# Patient Record
Sex: Female | Born: 1970 | Race: Black or African American | Hispanic: No | Marital: Married | Smoking: Never smoker
Health system: Southern US, Community
[De-identification: ages and names within clinical notes are randomized; demographics above are authoritative.]

## PROBLEM LIST (undated history)

## (undated) DIAGNOSIS — I10 Essential (primary) hypertension: Secondary | ICD-10-CM

## (undated) DIAGNOSIS — R87619 Unspecified abnormal cytological findings in specimens from cervix uteri: Secondary | ICD-10-CM

## (undated) DIAGNOSIS — G43909 Migraine, unspecified, not intractable, without status migrainosus: Secondary | ICD-10-CM

## (undated) HISTORY — DX: Unspecified abnormal cytological findings in specimens from cervix uteri: R87.619

---

## 2004-10-13 ENCOUNTER — Emergency Department: Payer: Self-pay | Admitting: Internal Medicine

## 2005-02-17 ENCOUNTER — Emergency Department: Payer: Self-pay | Admitting: General Practice

## 2005-02-28 ENCOUNTER — Ambulatory Visit: Payer: Self-pay | Admitting: Specialist

## 2005-05-25 ENCOUNTER — Emergency Department: Payer: Self-pay | Admitting: Emergency Medicine

## 2005-06-05 ENCOUNTER — Emergency Department: Payer: Self-pay | Admitting: Internal Medicine

## 2005-11-01 ENCOUNTER — Emergency Department: Payer: Self-pay | Admitting: Unknown Physician Specialty

## 2006-02-08 ENCOUNTER — Emergency Department: Payer: Self-pay | Admitting: Emergency Medicine

## 2006-02-10 ENCOUNTER — Ambulatory Visit: Payer: Self-pay | Admitting: Internal Medicine

## 2006-11-27 ENCOUNTER — Emergency Department: Payer: Self-pay | Admitting: Emergency Medicine

## 2006-11-28 ENCOUNTER — Other Ambulatory Visit: Payer: Self-pay

## 2009-02-13 ENCOUNTER — Emergency Department: Payer: Self-pay | Admitting: Emergency Medicine

## 2010-10-25 IMAGING — CT CT HEAD WITHOUT CONTRAST
3 of 4 series · 18 of 30 positions shown, 20 images · non-contrast
Comparison: none

REASON FOR EXAM: severe headache
COMMENTS:

[Series 2: without · axial · non-contrast · 0.41mm/px · z∈[-155,-35]mm · 8 of 32 slices shown, 10 images]
[im 4/32  brain]
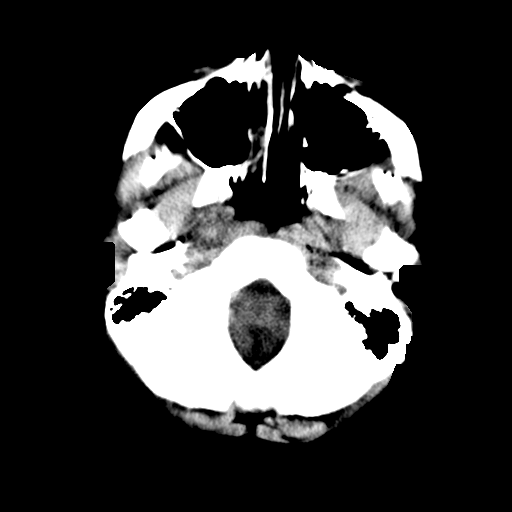
[im 4/32  bone]
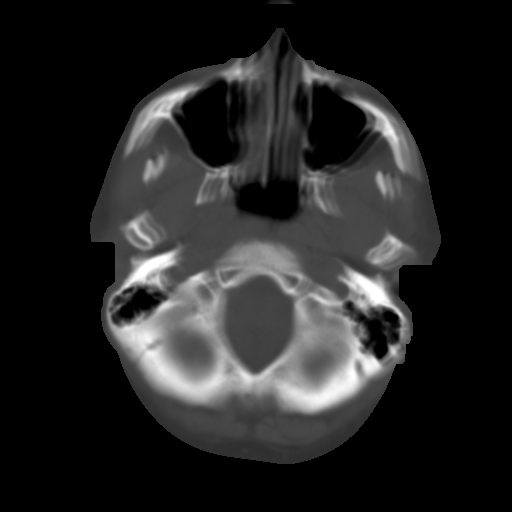
[im 7/32  brain]
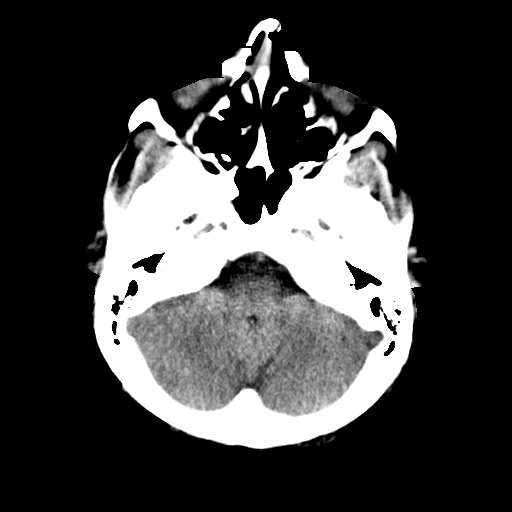
[im 11/32  brain]
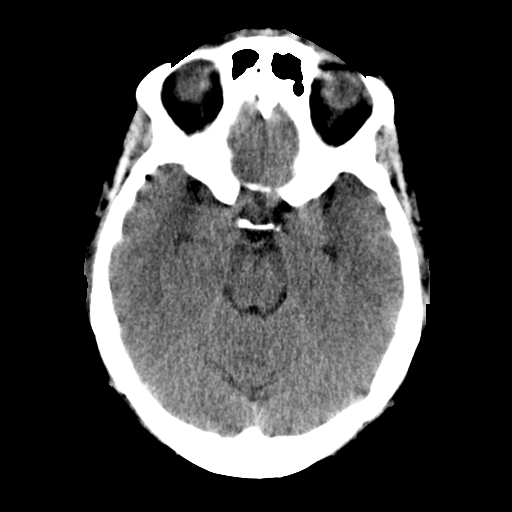
[im 14/32  brain]
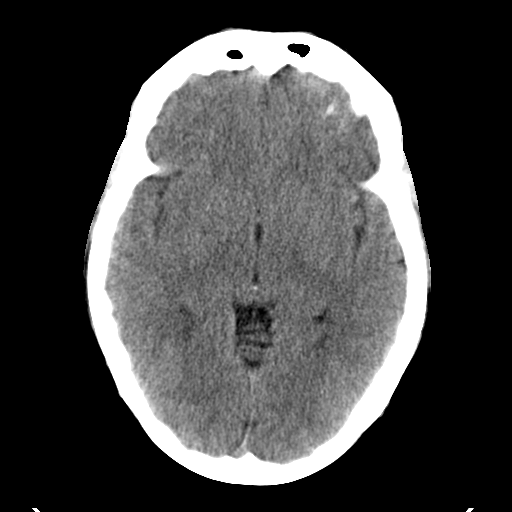
[im 18/32  brain]
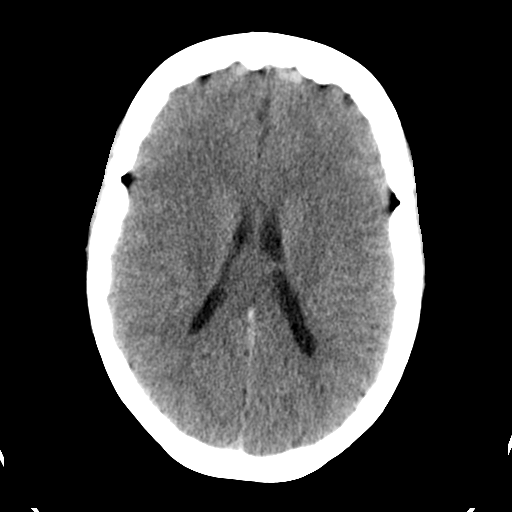
[im 18/32  bone]
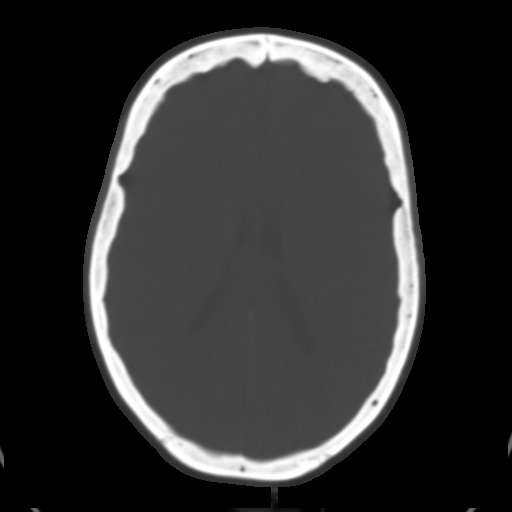
[im 21/32  brain]
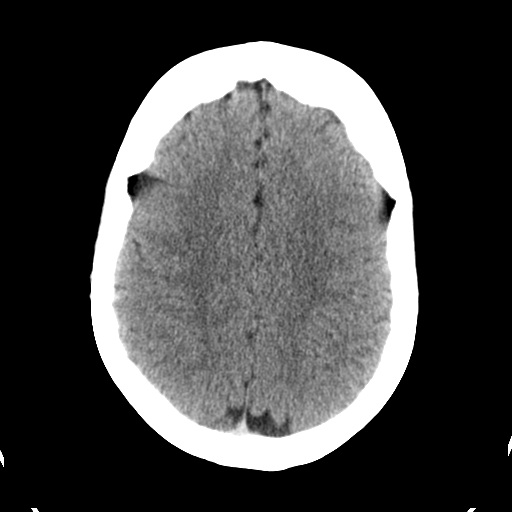
[im 25/32  brain]
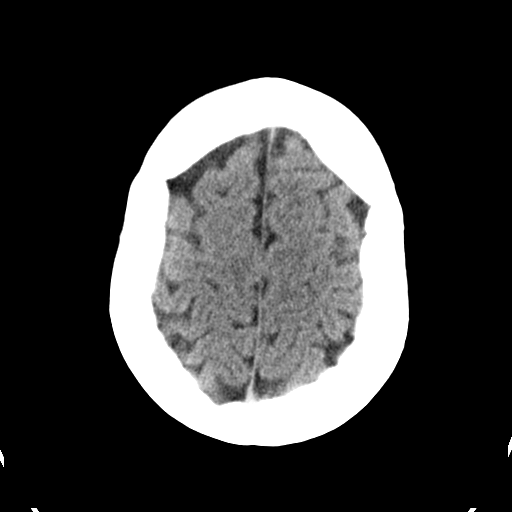
[im 28/32  brain]
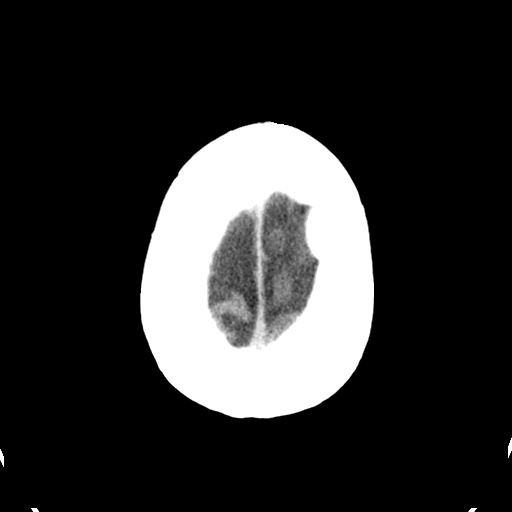

[Series 3: bone · axial · 0.41mm/px · z∈[-155,-50]mm · 7 of 32 slices shown]
[im 4/32  bone]
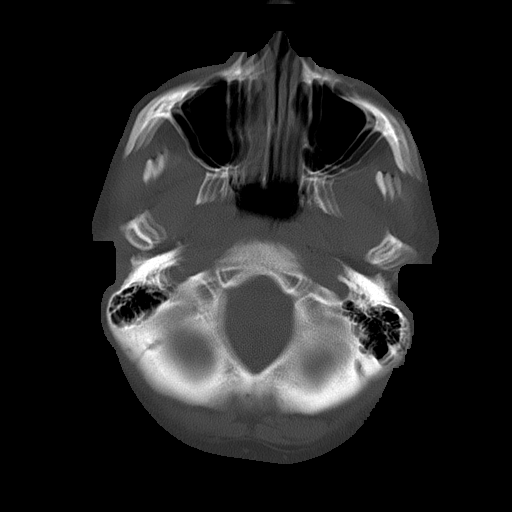
[im 7/32  bone]
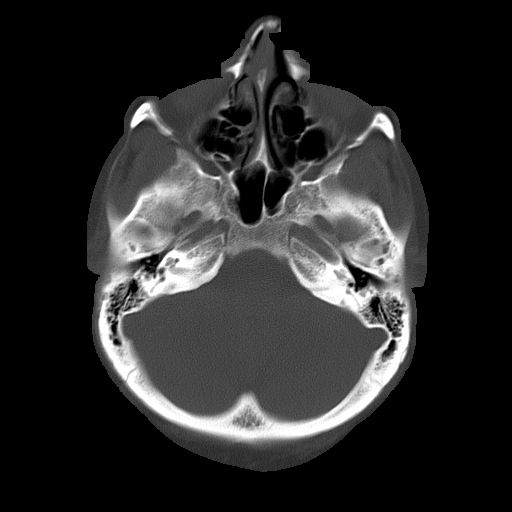
[im 11/32  bone]
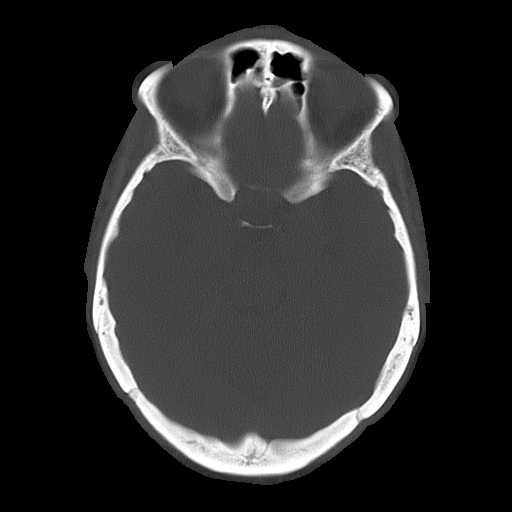
[im 14/32  bone]
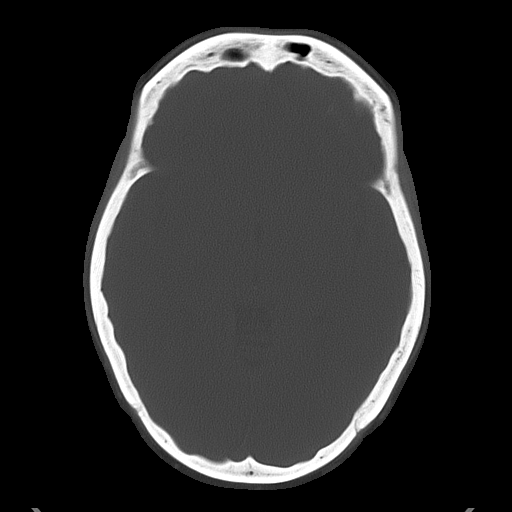
[im 18/32  bone]
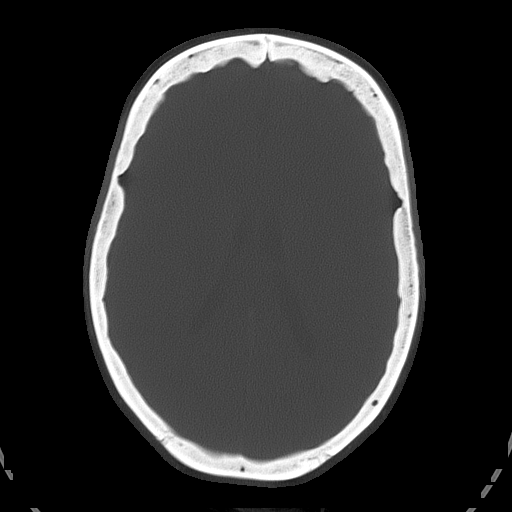
[im 21/32  bone]
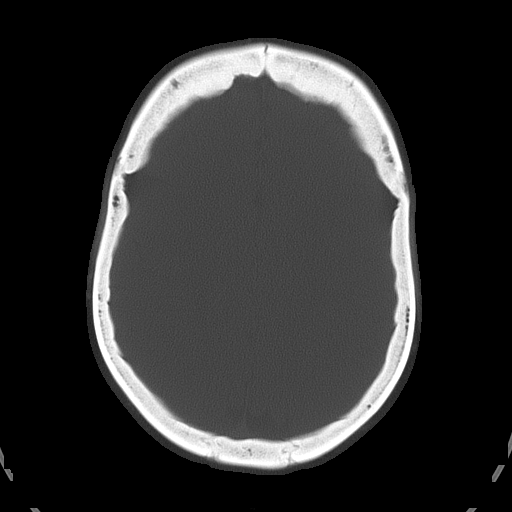
[im 25/32  bone]
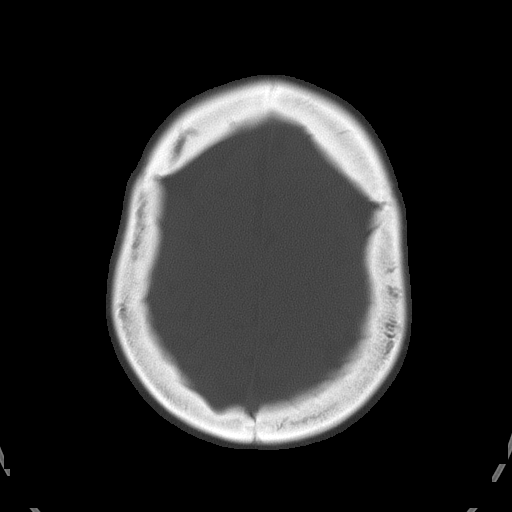

[Series 4: without repeat · axial · non-contrast · 0.41mm/px · z∈[-155,-125]mm · 3 of 14 slices shown]
[im 4/14  brain]
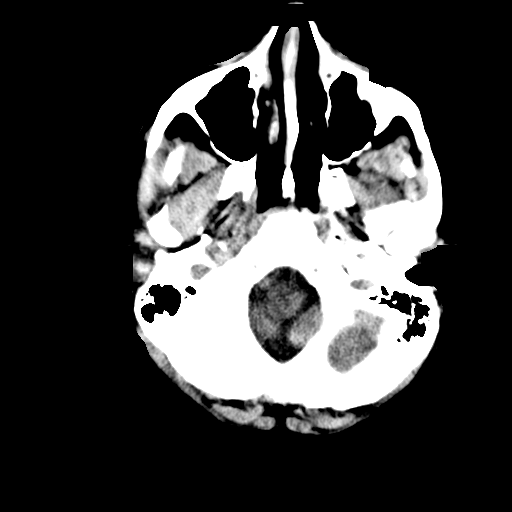
[im 7/14  brain]
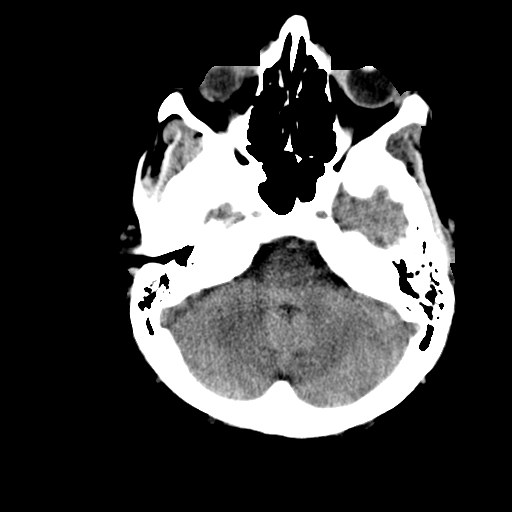
[im 10/14  brain]
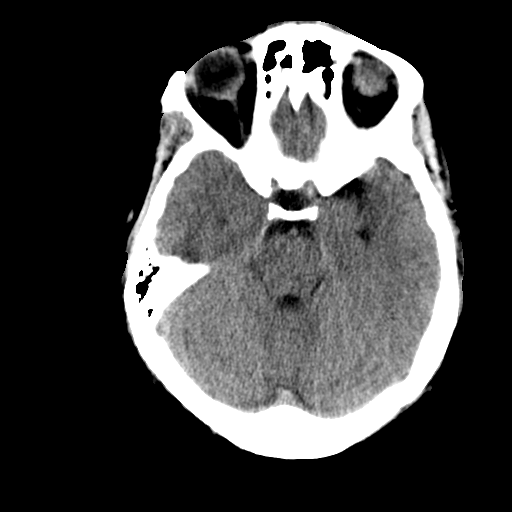

[18 of 30 positions shown; findings below may reference images not displayed]

PROCEDURE:     CT  - CT HEAD WITHOUT CONTRAST  - February 13, 2009  [DATE]

RESULT:     History: Migraines. Comparison study: No prior

Procedure and findings: Standard nonenhanced head CT obtained. No mass
lesion noted. No hydrocephalus noted. No acute abnormalities identified. No
acute bony undermines identified.
IMPRESSION: No acute abnormality.

## 2012-05-23 ENCOUNTER — Emergency Department: Payer: Self-pay | Admitting: *Deleted

## 2012-05-23 LAB — BASIC METABOLIC PANEL
Anion Gap: 8 (ref 7–16)
BUN: 12 mg/dL (ref 7–18)
Creatinine: 0.7 mg/dL (ref 0.60–1.30)
EGFR (African American): >60
Glucose: 98 mg/dL (ref 65–99)
Osmolality: 281 (ref 275–301)

## 2013-06-02 ENCOUNTER — Emergency Department: Payer: Self-pay | Admitting: Internal Medicine

## 2014-08-14 ENCOUNTER — Ambulatory Visit: Payer: Self-pay | Admitting: Podiatry

## 2014-08-19 DIAGNOSIS — I10 Essential (primary) hypertension: Secondary | ICD-10-CM | POA: Insufficient documentation

## 2014-09-09 ENCOUNTER — Ambulatory Visit: Payer: Self-pay | Admitting: Podiatry

## 2015-12-28 ENCOUNTER — Ambulatory Visit
Admission: RE | Admit: 2015-12-28 | Discharge: 2015-12-28 | Disposition: A | Discharge status: Home / Self Care | Payer: Commercial Managed Care - PPO | Source: Ambulatory Visit | Attending: Nurse Practitioner | Admitting: Nurse Practitioner

## 2015-12-28 ENCOUNTER — Other Ambulatory Visit: Payer: Self-pay | Admitting: Nurse Practitioner

## 2015-12-28 DIAGNOSIS — M79605 Pain in left leg: Secondary | ICD-10-CM | POA: Diagnosis present

## 2016-01-24 ENCOUNTER — Emergency Department
Admission: EM | Admit: 2016-01-24 | Discharge: 2016-01-24 | Disposition: A | Discharge status: Home / Self Care | Payer: Commercial Managed Care - PPO | Attending: Emergency Medicine | Admitting: Emergency Medicine

## 2016-01-24 DIAGNOSIS — M5432 Sciatica, left side: Secondary | ICD-10-CM

## 2016-01-24 DIAGNOSIS — M5442 Lumbago with sciatica, left side: Secondary | ICD-10-CM | POA: Insufficient documentation

## 2016-01-24 DIAGNOSIS — M545 Low back pain: Secondary | ICD-10-CM | POA: Diagnosis present

## 2016-01-24 MED ORDER — IBUPROFEN 800 MG PO TABS: 800.0000 mg | ORAL_TABLET | Freq: Three times a day (TID) | ORAL | Status: DC | PRN

## 2016-01-24 MED ORDER — OXYCODONE-ACETAMINOPHEN 5-325 MG PO TABS: 1.0000 | ORAL_TABLET | ORAL | Status: DC | PRN

## 2016-01-24 MED ORDER — HYDROMORPHONE HCL 1 MG/ML IJ SOLN
1.0000 mg | Freq: Once | INTRAMUSCULAR | Status: AC
  Administered 2016-01-24: 1 mg via INTRAMUSCULAR
  Filled 2016-01-24: qty 1

## 2016-01-24 MED ORDER — BACLOFEN 10 MG PO TABS: 10.0000 mg | ORAL_TABLET | Freq: Three times a day (TID) | ORAL | Status: DC

## 2016-01-24 MED ORDER — DIAZEPAM 5 MG/ML IJ SOLN
5.0000 mg | Freq: Once | INTRAMUSCULAR | Status: AC
  Administered 2016-01-24: 5 mg via INTRAMUSCULAR
  Filled 2016-01-24: qty 2

## 2016-01-24 NOTE — ED Provider Notes (Signed)
[facility] Emergency Department Provider Note  ____________________________________________  Time seen: Approximately 12:06 PM  I have reviewed the triage vital signs and the nursing notes.   HISTORY  Chief Complaint Back Pain and Leg Pain    HPI Sheila Banks is a 45 y.o. female presents for evaluation of acute exacerbation of sciatica down her left leg. Patient states that she has a orthopedic clinic that she goes to on a regular basis and schedule for an MRI later this week. States an acute exacerbation last couple days. Denies any recent trauma describes her pain as 10 over 10. Patient reports that she's had multiple x-rays and ultrasounds of her leg and is just waiting for the MRI test.   No past medical history on file.  There are no active problems to display for this patient.   No past surgical history on file.  Current Outpatient Rx  Name  Route  Sig  Dispense  Refill  . baclofen (LIORESAL) 10 MG tablet   Oral   Take 1 tablet (10 mg total) by mouth 3 (three) times daily.   30 tablet   0   . ibuprofen (ADVIL,MOTRIN) 800 MG tablet   Oral   Take 1 tablet (800 mg total) by mouth every 8 (eight) hours as needed.   30 tablet   0   . oxyCODONE-acetaminophen (ROXICET) 5-325 MG tablet   Oral   Take 1-2 tablets by mouth every 4 (four) hours as needed for severe pain.   15 tablet   0     Allergies Penicillins  No family history on file.  Social History Social History  Substance Use Topics  . Smoking status: Not on file  . Smokeless tobacco: Not on file  . Alcohol Use: Not on file    Review of Systems Constitutional: No fever/chills Cardiovascular: Denies chest pain. Respiratory: Denies shortness of breath. Musculoskeletal: As if her lower back pain and left side radiating down the left leg. Skin: Negative for rash. Neurological: Negative for headaches, focal weakness or numbness.  10-point ROS otherwise  negative.  ____________________________________________   PHYSICAL EXAM:  VITAL SIGNS: ED Triage Vitals  Enc Vitals Group     BP 01/24/16 1203 126/82 mmHg     Pulse Rate 01/24/16 1203 97     Resp 01/24/16 1203 18     Temp 01/24/16 1203 98.3 F (36.8 C)     Temp Source 01/24/16 1203 Oral     SpO2 01/24/16 1203 96 %     Weight 01/24/16 1204 176 lb (79.833 kg)     Height 01/24/16 1204 5' 2.5" (1.588 m)     Head Cir --      Peak Flow --      Pain Score 01/24/16 1203 10     Pain Loc --      Pain Edu? --      Excl. in GC? --     Constitutional: Alert and oriented. Well appearing and in no acute distress.  Cardiovascular: Normal rate, regular rhythm. Grossly normal heart sounds.  Good peripheral circulation. Respiratory: Normal respiratory effort.  No retractions. Lungs CTAB. Musculoskeletal: Left leg distally neurovascularly intact. Pedal pulses. Straight leg raise positive on the left at about 10-15. Neurologic:  Normal speech and language. No gross focal neurologic deficits are appreciated. No gait instability. Skin:  Skin is warm, dry and intact. No rash noted. Psychiatric: Mood and affect are normal. Speech and behavior are normal.  ____________________________________________   LABS (all  labs ordered are listed, but only abnormal results are displayed)  Labs Reviewed - No data to display ____________________________________________  EKG   ____________________________________________  RADIOLOGY  Deferred at this time ____________________________________________   PROCEDURES  Procedure(s) performed: None  Critical Care performed: No  ____________________________________________   INITIAL IMPRESSION / ASSESSMENT AND PLAN / ED COURSE  Pertinent labs & imaging results that were available during my care of the patient were reviewed by me and considered in my medical decision making (see chart for details).  Acute exacerbation of sciatica. Encourage  patient to increase her gabapentin 1 tablet every 3 days to a maximum of 2700 mg daily or 3 tablets 3 times daily. Rx given for baclofen 10 mg 3 times a day ibuprofen 800 mg 3 times a day. We'll give her a three-day course of Percocet so she can follow up with her PCP. ____________________________________________   FINAL CLINICAL IMPRESSION(S) / ED DIAGNOSES  Final diagnoses:  Sciatica of left side     This chart was dictated using voice recognition software/Dragon. Despite best efforts to proofread, errors can occur which can change the meaning. Any change was purely unintentional.   Evangeline Dakin, PA-C 01/24/16 1212  Minna Antis, MD 01/24/16 [phone]

## 2016-01-24 NOTE — Discharge Instructions (Signed)
Radicular Pain °Radicular pain in either the arm or leg is usually from a bulging or herniated disk in the spine. A piece of the herniated disk may press against the nerves as the nerves exit the spine. This causes pain which is felt at the tips of the nerves down the arm or leg. Other causes of radicular pain may include: °· Fractures. °· Heart disease. °· Cancer. °· An abnormal and usually degenerative state of the nervous system or nerves (neuropathy). °Diagnosis may require CT or MRI scanning to determine the primary cause.  °Nerves that start at the neck (nerve roots) may cause radicular pain in the outer shoulder and arm. It can spread down to the thumb and fingers. The symptoms vary depending on which nerve root has been affected. In most cases radicular pain improves with conservative treatment. Neck problems may require physical therapy, a neck collar, or cervical traction. Treatment may take many weeks, and surgery may be considered if the symptoms do not improve.  °Conservative treatment is also recommended for sciatica. Sciatica causes pain to radiate from the lower back or buttock area down the leg into the foot. Often there is a history of back problems. Most patients with sciatica are better after 2 to 4 weeks of rest and other supportive care. Short term bed rest can reduce the disk pressure considerably. Sitting, however, is not a good position since this increases the pressure on the disk. You should avoid bending, lifting, and all other activities which make the problem worse. Traction can be used in severe cases. Surgery is usually reserved for patients who do not improve within the first months of treatment. °Only take over-the-counter or prescription medicines for pain, discomfort, or fever as directed by your caregiver. Narcotics and muscle relaxants may help by relieving more severe pain and spasm and by providing mild sedation. Cold or massage can give significant relief. Spinal manipulation  is not recommended. It can increase the degree of disc protrusion. Epidural steroid injections are often effective treatment for radicular pain. These injections deliver medicine to the spinal nerve in the space between the protective covering of the spinal cord and back bones (vertebrae). Your caregiver can give you more information about steroid injections. These injections are most effective when given within two weeks of the onset of pain.  °You should see your caregiver for follow up care as recommended. A program for neck and back injury rehabilitation with stretching and strengthening exercises is an important part of management.  °SEEK IMMEDIATE MEDICAL CARE IF: °· You develop increased pain, weakness, or numbness in your arm or leg. °· You develop difficulty with bladder or bowel control. °· You develop abdominal pain. °  °This information is not intended to replace advice given to you by your health care provider. Make sure you discuss any questions you have with your health care provider. °  °Document Released: 09/22/2004 Document Revised: 09/05/2014 Document Reviewed: 03/11/2015 °Elsevier Interactive Patient Education ©2016 [employer] ° °Sciatica °Sciatica is pain, weakness, numbness, or tingling along the path of the sciatic nerve. The nerve starts in the lower back and runs down the back of each leg. The nerve controls the muscles in the lower leg and in the back of the knee, while also providing sensation to the back of the thigh, lower leg, and the sole of your foot. Sciatica is a symptom of another medical condition. For instance, nerve damage or certain conditions, such as a herniated disk or bone spur on the   spine, pinch or put pressure on the sciatic nerve. This causes the pain, weakness, or other sensations normally associated with sciatica. Generally, sciatica only affects one side of the body. °CAUSES  °· Herniated or slipped disc. °· Degenerative disk disease. °· A pain disorder involving  the narrow muscle in the buttocks (piriformis syndrome). °· Pelvic injury or fracture. °· Pregnancy. °· Tumor (rare). °SYMPTOMS  °Symptoms can vary from mild to very severe. The symptoms usually travel from the low back to the buttocks and down the back of the leg. Symptoms can include: °· Mild tingling or dull aches in the lower back, leg, or hip. °· Numbness in the back of the calf or sole of the foot. °· Burning sensations in the lower back, leg, or hip. °· Sharp pains in the lower back, leg, or hip. °· Leg weakness. °· Severe back pain inhibiting movement. °These symptoms may get worse with coughing, sneezing, laughing, or prolonged sitting or standing. Also, being overweight may worsen symptoms. °DIAGNOSIS  °Your caregiver will perform a physical exam to look for common symptoms of sciatica. He or she may ask you to do certain movements or activities that would trigger sciatic nerve pain. Other tests may be performed to find the cause of the sciatica. These may include: °· Blood tests. °· X-rays. °· Imaging tests, such as an MRI or CT scan. °TREATMENT  °Treatment is directed at the cause of the sciatic pain. Sometimes, treatment is not necessary and the pain and discomfort goes away on its own. If treatment is needed, your caregiver may suggest: °· Over-the-counter medicines to relieve pain. °· Prescription medicines, such as anti-inflammatory medicine, muscle relaxants, or narcotics. °· Applying heat or ice to the painful area. °· Steroid injections to lessen pain, irritation, and inflammation around the nerve. °· Reducing activity during periods of pain. °· Exercising and stretching to strengthen your abdomen and improve flexibility of your spine. Your caregiver may suggest losing weight if the extra weight makes the back pain worse. °· Physical therapy. °· Surgery to eliminate what is pressing or pinching the nerve, such as a bone spur or part of a herniated disk. °HOME CARE INSTRUCTIONS  °· Only take  over-the-counter or prescription medicines for pain or discomfort as directed by your caregiver. °· Apply ice to the affected area for 20 minutes, 3-4 times a day for the first 48-72 hours. Then try heat in the same way. °· Exercise, stretch, or perform your usual activities if these do not aggravate your pain. °· Attend physical therapy sessions as directed by your caregiver. °· Keep all follow-up appointments as directed by your caregiver. °· Do not wear high heels or shoes that do not provide proper support. °· Check your mattress to see if it is too soft. A firm mattress may lessen your pain and discomfort. °SEEK IMMEDIATE MEDICAL CARE IF:  °· You lose control of your bowel or bladder (incontinence). °· You have increasing weakness in the lower back, pelvis, buttocks, or legs. °· You have redness or swelling of your back. °· You have a burning sensation when you urinate. °· You have pain that gets worse when you lie down or awakens you at night. °· Your pain is worse than you have experienced in the past. °· Your pain is lasting longer than 4 weeks. °· You are suddenly losing weight without reason. °MAKE SURE YOU: °· Understand these instructions. °· Will watch your condition. °· Will get help right away if you are not doing   well or get worse. °  °This information is not intended to replace advice given to you by your health care provider. Make sure you discuss any questions you have with your health care provider. °  °Document Released: 08/09/2001 Document Revised: 05/06/2015 Document Reviewed: 12/25/2011 °Elsevier Interactive Patient Education ©2016 Elsevier Inc. ° °

## 2016-01-24 NOTE — ED Notes (Signed)
Pt has hx of sciatic nerve pain with pain radiating down left leg.  Pt states she has taken multiple medications like muscle relaxants ands steroids and neurontin but nothing is helping.  Pt c/o pain with standing and sitting. Scheduled for MRI this week.

## 2016-06-14 DIAGNOSIS — M5116 Intervertebral disc disorders with radiculopathy, lumbar region: Secondary | ICD-10-CM | POA: Insufficient documentation

## 2016-11-30 ENCOUNTER — Other Ambulatory Visit: Payer: Self-pay | Admitting: Family Medicine

## 2016-11-30 DIAGNOSIS — Z1231 Encounter for screening mammogram for malignant neoplasm of breast: Secondary | ICD-10-CM

## 2017-01-03 ENCOUNTER — Emergency Department
Admission: EM | Admit: 2017-01-03 | Discharge: 2017-01-03 | Disposition: A | Discharge status: Home / Self Care | Payer: Commercial Managed Care - PPO | Attending: Student in an Organized Health Care Education/Training Program | Admitting: Student in an Organized Health Care Education/Training Program

## 2017-01-03 ENCOUNTER — Emergency Department: Payer: Commercial Managed Care - PPO

## 2017-01-03 DIAGNOSIS — R1011 Right upper quadrant pain: Secondary | ICD-10-CM | POA: Diagnosis not present

## 2017-01-03 DIAGNOSIS — Z791 Long term (current) use of non-steroidal anti-inflammatories (NSAID): Secondary | ICD-10-CM | POA: Insufficient documentation

## 2017-01-03 DIAGNOSIS — R112 Nausea with vomiting, unspecified: Secondary | ICD-10-CM

## 2017-01-03 DIAGNOSIS — R1013 Epigastric pain: Secondary | ICD-10-CM | POA: Insufficient documentation

## 2017-01-03 DIAGNOSIS — R42 Dizziness and giddiness: Secondary | ICD-10-CM | POA: Diagnosis present

## 2017-01-03 LAB — CBC
HCT: 36.9 % (ref 35.0–47.0)
Hemoglobin: 12.7 g/dL (ref 12.0–16.0)
MCH: 31 pg (ref 26.0–34.0)
MCHC: 34.4 g/dL (ref 32.0–36.0)
MCV: 90.1 fL (ref 80.0–100.0)
PLATELETS: 277 10*3/uL (ref 150–440)
RBC: 4.1 MIL/uL (ref 3.80–5.20)
RDW: 14.3 % (ref 11.5–14.5)
WBC: 6.9 10*3/uL (ref 3.6–11.0)

## 2017-01-03 LAB — COMPREHENSIVE METABOLIC PANEL
ALK PHOS: 67 U/L (ref 38–126)
ALT: 22 U/L (ref 14–54)
ANION GAP: 7 (ref 5–15)
AST: 25 U/L (ref 15–41)
Albumin: 3.9 g/dL (ref 3.5–5.0)
BILIRUBIN TOTAL: 0.8 mg/dL (ref 0.3–1.2)
BUN: 16 mg/dL (ref 6–20)
CALCIUM: 9.3 mg/dL (ref 8.9–10.3)
CO2: 27 mmol/L (ref 22–32)
Chloride: 107 mmol/L (ref 101–111)
Creatinine, Ser: 0.66 mg/dL (ref 0.44–1.00)
GFR calc non Af Amer: >60 mL/min (ref >60)
GLUCOSE: 98 mg/dL (ref 65–99)
Potassium: 3.2 mmol/L — ABNORMAL LOW (ref 3.5–5.1)
Sodium: 141 mmol/L (ref 135–145)
Total Protein: 7.1 g/dL (ref 6.5–8.1)

## 2017-01-03 LAB — URINALYSIS, COMPLETE (UACMP) WITH MICROSCOPIC
Bacteria, UA: NONE SEEN
Bilirubin Urine: NEGATIVE
GLUCOSE, UA: NEGATIVE mg/dL
Hgb urine dipstick: NEGATIVE
Ketones, ur: NEGATIVE mg/dL
Leukocytes, UA: NEGATIVE
Nitrite: NEGATIVE
PH: 7 (ref 5.0–8.0)
Protein, ur: 30 mg/dL — AB
Specific Gravity, Urine: 1.018 (ref 1.005–1.030)

## 2017-01-03 LAB — POCT PREGNANCY, URINE: Preg Test, Ur: NEGATIVE

## 2017-01-03 LAB — LIPASE, BLOOD: Lipase: 20 U/L (ref 11–51)

## 2017-01-03 MED ORDER — SODIUM CHLORIDE 0.9 % IV BOLUS (SEPSIS)
1000.0000 mL | Freq: Once | INTRAVENOUS | Status: AC
  Administered 2017-01-03: 1000 mL via INTRAVENOUS

## 2017-01-03 MED ORDER — DICYCLOMINE HCL 10 MG PO CAPS: 10.0000 mg | ORAL_CAPSULE | Freq: Three times a day (TID) | ORAL | 0 refills | Status: DC | PRN

## 2017-01-03 MED ORDER — PROMETHAZINE HCL 12.5 MG PO TABS
12.5000 mg | ORAL_TABLET | Freq: Four times a day (QID) | ORAL | 0 refills | Status: DC | PRN
Start: 2017-01-03 — End: 2018-07-23

## 2017-01-03 MED ORDER — PROMETHAZINE HCL 25 MG/ML IJ SOLN
12.5000 mg | Freq: Once | INTRAMUSCULAR | Status: AC
  Administered 2017-01-03: 12.5 mg via INTRAVENOUS
  Filled 2017-01-03 (×2): qty 1

## 2017-01-03 NOTE — ED Provider Notes (Signed)
[facility] Emergency Department Provider Note    First MD Initiated Contact with Patient 01/03/17 1402     (approximate)  I have reviewed the triage vital signs and the nursing notes.   HISTORY  Chief Complaint Dizziness and Abdominal Pain    HPI Sheila Banks is a 46 y.o. 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 Patient states that she ate a hotdog a hamburger and salad. Denies any fevers. Throughout the morning the patient has had persistent discomfort and nausea whenever she tries to eat even small bites of crackers. Has started to feel dizzy and she's not been able to keep anything on her stomach. Denies any chest pain. No shortness of breath. No previous abdominal surgeries.   History reviewed. No pertinent past medical history. FMH: no bleeding disorders History reviewed. No pertinent surgical history. There are no active problems to display for this patient.     Prior to Admission medications   Medication Sig Start Date End Date Taking? Authorizing Provider  baclofen (LIORESAL) 10 MG tablet Take 1 tablet (10 mg total) by mouth 3 (three) times daily. 01/24/16   Beers, Charmayne Sheer, PA-C  dicyclomine (BENTYL) 10 MG capsule Take 1 capsule (10 mg total) by mouth 3 (three) times daily as needed for spasms. 01/03/17 01/17/17  Willy Eddy, MD  ibuprofen (ADVIL,MOTRIN) 800 MG tablet Take 1 tablet (800 mg total) by mouth every 8 (eight) hours as needed. 01/24/16   Beers, Charmayne Sheer, PA-C  oxyCODONE-acetaminophen (ROXICET) 5-325 MG tablet Take 1-2 tablets by mouth every 4 (four) hours as needed for severe pain. 01/24/16   Beers, Charmayne Sheer, PA-C  promethazine (PHENERGAN) 12.5 MG tablet Take 1 tablet (12.5 mg total) by mouth every 6 (six) hours as needed for nausea or vomiting. 01/03/17   Willy Eddy, MD     Allergies Penicillins    Social History Social History  Substance Use Topics  . Smoking status: Never Smoker  . Smokeless tobacco: Never Used  . Alcohol use No    Review of Systems Patient denies headaches, rhinorrhea, blurry vision, numbness, shortness of breath, chest pain, edema, cough, abdominal pain, nausea, vomiting, diarrhea, dysuria, fevers, rashes or hallucinations unless otherwise stated above in HPI. ____________________________________________   PHYSICAL EXAM:  VITAL SIGNS: Vitals:   01/03/17 1306  BP: (!) 151/93  Pulse: 77  Resp: 20  Temp: 98.1 F (36.7 C)    Constitutional: Alert and oriented. Well appearing and in no acute distress. Eyes: Conjunctivae are normal. PERRL. EOMI. Head: Atraumatic. Nose: No congestion/rhinnorhea. Mouth/Throat: Mucous membranes are moist.  Oropharynx non-erythematous. Neck: No stridor. Painless ROM. No cervical spine tenderness to palpation Hematological/Lymphatic/Immunilogical: No cervical lymphadenopathy. Cardiovascular: Normal rate, regular rhythm. Grossly normal heart sounds.  Good peripheral circulation. Respiratory: Normal respiratory effort.  No retractions. Lungs CTAB. Gastrointestinal: Soft with mild ttp in RUQ. No guarding or rebound. No distention. No abdominal bruits. No CVA tenderness. Genitourinary:  Musculoskeletal: No lower extremity tenderness nor edema.  No joint effusions. Neurologic:  Normal speech and language. No gross focal neurologic deficits are appreciated. No gait instability. Skin:  Skin is warm, dry and intact. No rash noted. Psychiatric: Mood and affect are normal. Speech and behavior are normal.  ____________________________________________   LABS (all labs ordered are listed, but only abnormal results are displayed)  Results for orders placed or performed during the hospital encounter of  01/03/17 (from the past 24 hour(s))  Lipase, blood     Status: None   Collection Time: 01/03/17   1:10 PM  Result Value Ref Range   Lipase 20 11 - 51 U/L  Comprehensive metabolic panel     Status: Abnormal   Collection Time: 01/03/17  1:10 PM  Result Value Ref Range   Sodium 141 135 - 145 mmol/L   Potassium 3.2 (L) 3.5 - 5.1 mmol/L   Chloride 107 101 - 111 mmol/L   CO2 27 22 - 32 mmol/L   Glucose, Bld 98 65 - 99 mg/dL   BUN 16 6 - 20 mg/dL   Creatinine, Ser 9.56 0.44 - 1.00 mg/dL   Calcium 9.3 8.9 - 21.3 mg/dL   Total Protein 7.1 6.5 - 8.1 g/dL   Albumin 3.9 3.5 - 5.0 g/dL   AST 25 15 - 41 U/L   ALT 22 14 - 54 U/L   Alkaline Phosphatase 67 38 - 126 U/L   Total Bilirubin 0.8 0.3 - 1.2 mg/dL   GFR calc non Af Amer >60 >60 mL/min   GFR calc Af Amer >60 >60 mL/min   Anion gap 7 5 - 15  CBC     Status: None   Collection Time: 01/03/17  1:10 PM  Result Value Ref Range   WBC 6.9 3.6 - 11.0 K/uL   RBC 4.10 3.80 - 5.20 MIL/uL   Hemoglobin 12.7 12.0 - 16.0 g/dL   HCT 08.6 57.8 - 46.9 %   MCV 90.1 80.0 - 100.0 fL   MCH 31.0 26.0 - 34.0 pg   MCHC 34.4 32.0 - 36.0 g/dL   RDW 62.9 52.8 - 41.3 %   Platelets 277 150 - 440 K/uL  Urinalysis, Complete w Microscopic     Status: Abnormal   Collection Time: 01/03/17  1:10 PM  Result Value Ref Range   Color, Urine YELLOW (A) YELLOW   APPearance HAZY (A) CLEAR   Specific Gravity, Urine 1.018 1.005 - 1.030   pH 7.0 5.0 - 8.0   Glucose, UA NEGATIVE NEGATIVE mg/dL   Hgb urine dipstick NEGATIVE NEGATIVE   Bilirubin Urine NEGATIVE NEGATIVE   Ketones, ur NEGATIVE NEGATIVE mg/dL   Protein, ur 30 (A) NEGATIVE mg/dL   Nitrite NEGATIVE NEGATIVE   Leukocytes, UA NEGATIVE NEGATIVE   RBC / HPF 0-5 0 - 5 RBC/hpf   WBC, UA 0-5 0 - 5 WBC/hpf   Bacteria, UA NONE SEEN NONE SEEN   Squamous Epithelial / LPF 0-5 (A) NONE SEEN   Mucous PRESENT   Pregnancy, urine POC     Status: None   Collection Time: 01/03/17  2:37 PM  Result Value Ref Range   Preg Test, Ur NEGATIVE NEGATIVE   ____________________________________________  EKG My review and  personal interpretation at Time: 13:12   Indication: dizziness  Rate: 70  Rhythm: sinus Axis: normal Other: normal intervals, non specific st changes ____________________________________________  RADIOLOGY  I personally reviewed all radiographic images ordered to evaluate for the above acute complaints and reviewed radiology reports and findings.  These findings were personally discussed with the patient.  Please see medical record for radiology report.  ____________________________________________   PROCEDURES  Procedure(s) performed:  Procedures    Critical Care performed: no ____________________________________________   INITIAL IMPRESSION / ASSESSMENT AND PLAN / ED COURSE  Pertinent labs & imaging results that were available during my care of the patient were reviewed by me and considered in my medical decision making (see chart  for details).  DDX: cholecystitis, cholelithiasis, gastritis, enteritis, food poisoning  Sheila Banks is a 46 y.o. who presents to the ED with acute right upper quadrant pain and nausea and vomiting as described above.  Patient is AFVSS in ED. Exam as above. Given current presentation have considered the above differential.  Based on location of pain and symptoms will order ultrasound evaluate for biliary pathology.  The patient will be placed on continuous pulse oximetry and telemetry for monitoring.  Laboratory evaluation will be sent to evaluate for the above complaints.     Clinical Course as of Jan 04 1540  Tue Jan 03, 2017  1537 Ultrasound is reassuring. Presentation most likely secondary to either food poisoning or viral illness. Patient's otherwise hemodynamically stable. Do not feel that emergent CT imaging clinically indicated with this presentation. We'll discharge with a prescription for antiemetics and pain medication. Patient stable for follow-up with her PCP.  Patient was able to tolerate PO and was able to ambulate with a steady  gait.  Have discussed with the patient and available family all diagnostics and treatments performed thus far and all questions were answered to the best of my ability. The patient demonstrates understanding and agreement with plan.   [PR]    Clinical Course User Index [PR] Willy Eddyobinson, Carlethia Mesquita, MD     ____________________________________________   FINAL CLINICAL IMPRESSION(S) / ED DIAGNOSES  Final diagnoses:  Abdominal pain, RUQ  Non-intractable vomiting with nausea, unspecified vomiting type      NEW MEDICATIONS STARTED DURING THIS VISIT:  New Prescriptions   DICYCLOMINE (BENTYL) 10 MG CAPSULE    Take 1 capsule (10 mg total) by mouth 3 (three) times daily as needed for spasms.   PROMETHAZINE (PHENERGAN) 12.5 MG TABLET    Take 1 tablet (12.5 mg total) by mouth every 6 (six) hours as needed for nausea or vomiting.     Note:  This document was prepared using Dragon voice recognition software and may include unintentional dictation errors.    Willy Eddyobinson, Alexzandria Massman, MD 01/03/17 [phone]

## 2017-01-03 NOTE — ED Triage Notes (Signed)
Pt started with epigastric dull aching last pm associated with standing - pt reports vomiting and is now c/o dizziness when she stands - vomited 8 times in 24 hours

## 2017-01-03 NOTE — ED Notes (Signed)
Pt unable to urinate at this time, given specimen cup and sent back out to lobby.

## 2017-01-03 NOTE — ED Notes (Signed)
Patient transported to Ultrasound 

## 2017-01-03 NOTE — Discharge Instructions (Signed)

## 2017-02-26 HISTORY — PX: BACK SURGERY: SHX140

## 2017-03-19 ENCOUNTER — Encounter: Payer: Self-pay | Admitting: Emergency Medicine

## 2017-03-19 ENCOUNTER — Emergency Department
Admission: EM | Admit: 2017-03-19 | Discharge: 2017-03-19 | Disposition: A | Discharge status: Home / Self Care | Payer: Commercial Managed Care - PPO | Attending: Emergency Medicine | Admitting: Emergency Medicine

## 2017-03-19 DIAGNOSIS — R1084 Generalized abdominal pain: Secondary | ICD-10-CM

## 2017-03-19 DIAGNOSIS — Z79899 Other long term (current) drug therapy: Secondary | ICD-10-CM | POA: Insufficient documentation

## 2017-03-19 DIAGNOSIS — R1013 Epigastric pain: Secondary | ICD-10-CM | POA: Diagnosis present

## 2017-03-19 DIAGNOSIS — R197 Diarrhea, unspecified: Secondary | ICD-10-CM

## 2017-03-19 LAB — URINALYSIS, COMPLETE (UACMP) WITH MICROSCOPIC
BILIRUBIN URINE: NEGATIVE
Bacteria, UA: NONE SEEN
Glucose, UA: NEGATIVE mg/dL
Hgb urine dipstick: NEGATIVE
Ketones, ur: 5 mg/dL — AB
LEUKOCYTES UA: NEGATIVE
Nitrite: NEGATIVE
PH: 5 (ref 5.0–8.0)
Protein, ur: NEGATIVE mg/dL
SPECIFIC GRAVITY, URINE: 1.021 (ref 1.005–1.030)

## 2017-03-19 LAB — CBC
HCT: 39.7 % (ref 35.0–47.0)
Hemoglobin: 13.5 g/dL (ref 12.0–16.0)
MCH: 30.9 pg (ref 26.0–34.0)
MCHC: 34.1 g/dL (ref 32.0–36.0)
MCV: 90.6 fL (ref 80.0–100.0)
PLATELETS: 261 10*3/uL (ref 150–440)
RBC: 4.38 MIL/uL (ref 3.80–5.20)
RDW: 14.3 % (ref 11.5–14.5)
WBC: 9.6 10*3/uL (ref 3.6–11.0)

## 2017-03-19 LAB — COMPREHENSIVE METABOLIC PANEL
ALK PHOS: 76 U/L (ref 38–126)
ALT: 17 U/L (ref 14–54)
AST: 20 U/L (ref 15–41)
Albumin: 3.9 g/dL (ref 3.5–5.0)
Anion gap: 7 (ref 5–15)
BILIRUBIN TOTAL: 0.6 mg/dL (ref 0.3–1.2)
BUN: 10 mg/dL (ref 6–20)
CHLORIDE: 106 mmol/L (ref 101–111)
CO2: 26 mmol/L (ref 22–32)
CREATININE: 0.8 mg/dL (ref 0.44–1.00)
Calcium: 9 mg/dL (ref 8.9–10.3)
GFR calc Af Amer: >60 mL/min (ref >60)
Glucose, Bld: 95 mg/dL (ref 65–99)
Potassium: 3.2 mmol/L — ABNORMAL LOW (ref 3.5–5.1)
Sodium: 139 mmol/L (ref 135–145)
TOTAL PROTEIN: 7.2 g/dL (ref 6.5–8.1)

## 2017-03-19 LAB — LIPASE, BLOOD: Lipase: 17 U/L (ref 11–51)

## 2017-03-19 LAB — POCT PREGNANCY, URINE: Preg Test, Ur: NEGATIVE

## 2017-03-19 MED ORDER — MORPHINE SULFATE (PF) 4 MG/ML IV SOLN
4.0000 mg | Freq: Once | INTRAVENOUS | Status: AC
  Administered 2017-03-19: 4 mg via INTRAVENOUS
  Filled 2017-03-19: qty 1

## 2017-03-19 MED ORDER — ONDANSETRON HCL 4 MG/2ML IJ SOLN
4.0000 mg | Freq: Once | INTRAMUSCULAR | Status: AC
  Administered 2017-03-19: 4 mg via INTRAVENOUS
  Filled 2017-03-19: qty 2

## 2017-03-19 MED ORDER — ONDANSETRON HCL 4 MG/2ML IJ SOLN: 4.0000 mg | Freq: Once | INTRAMUSCULAR | Status: DC | PRN

## 2017-03-19 MED ORDER — SODIUM CHLORIDE 0.9 % IV BOLUS (SEPSIS)
1000.0000 mL | Freq: Once | INTRAVENOUS | Status: AC
  Administered 2017-03-19: 1000 mL via INTRAVENOUS

## 2017-03-19 NOTE — ED Notes (Signed)
Pt offered Zofran but is not currently nauseated

## 2017-03-19 NOTE — ED Provider Notes (Signed)
[facility] Emergency Department Provider Note ____________________________________________   I have reviewed the triage vital signs and the triage nursing note.  HISTORY  Chief Complaint Abdominal Pain   Historian Patient  HPI Sheila Banks is a 46 y.o. female presenting for evaluation of 4 days of epigastric and left upper quadrant pain associated also with multiple episodes of frequent loose watery stools which is nonbloody. No fever. No known food exposures, sick contacts, or significant travel history. Pain is moderate at present and it waxes and wanes. Nothing makes it worse or better. She thought at first it was a virus and would just go away, but hasn't.  No pelvic pain or vaginal discharge or vaginal bleeding.  No right upper quadrant pain.    History reviewed. No pertinent past medical history.  There are no active problems to display for this patient.   History reviewed. No pertinent surgical history.  Prior to Admission medications   Medication Sig Start Date End Date Taking? Authorizing Provider  baclofen (LIORESAL) 10 MG tablet Take 1 tablet (10 mg total) by mouth 3 (three) times daily. 01/24/16   Beers, Charmayne Sheerharles M, PA-C  dicyclomine (BENTYL) 10 MG capsule Take 1 capsule (10 mg total) by mouth 3 (three) times daily as needed for spasms. 01/03/17 01/17/17  Willy Eddyobinson, Patrick, MD  ibuprofen (ADVIL,MOTRIN) 800 MG tablet Take 1 tablet (800 mg total) by mouth every 8 (eight) hours as needed. 01/24/16   Beers, Charmayne Sheerharles M, PA-C  oxyCODONE-acetaminophen (ROXICET) 5-325 MG tablet Take 1-2 tablets by mouth every 4 (four) hours as needed for severe pain. 01/24/16   Beers, Charmayne Sheerharles M, PA-C  promethazine (PHENERGAN) 12.5 MG tablet Take 1 tablet (12.5 mg total) by mouth every 6 (six) hours as needed for nausea or vomiting. 01/03/17   Willy Eddyobinson, Patrick, MD    Allergies  Allergen Reactions  . Penicillins Hives    History reviewed. No pertinent family  history.  Social History Social History  Substance Use Topics  . Smoking status: Never Smoker  . Smokeless tobacco: Never Used  . Alcohol use No    Review of Systems  Constitutional: Negative for fever. Eyes: Negative for visual changes. ENT: Negative for sore throat. Cardiovascular: Negative for chest pain. Respiratory: Negative for shortness of breath. Gastrointestinal: Negative for Bloody stools. Genitourinary: Negative for dysuria, but mild hesitancy. Musculoskeletal: Negative for back pain. Skin: Negative for rash. Neurological: Negative for headache.  ____________________________________________   PHYSICAL EXAM:  VITAL SIGNS: ED Triage Vitals  Enc Vitals Group     BP 03/19/17 0724 (!) 146/95     Pulse Rate 03/19/17 0724 83     Resp 03/19/17 0724 18     Temp 03/19/17 0724 98.3 F (36.8 C)     Temp Source 03/19/17 0724 Oral     SpO2 03/19/17 0724 98 %     Weight 03/19/17 0724 180 lb (81.6 kg)     Height 03/19/17 0724 5\' 3"  (1.6 m)     Head Circumference --      Peak Flow --      Pain Score 03/19/17 0727 9     Pain Loc --      Pain Edu? --      Excl. in GC? --      Constitutional: Alert and oriented. Well appearing and in no distress. HEENT   Head: Normocephalic and atraumatic.      Eyes: Conjunctivae are normal. Pupils equal and round.       Ears:  Nose: No congestion/rhinnorhea.   Mouth/Throat: Mucous membranes are moist.   Neck: No stridor. Cardiovascular/Chest: Normal rate, regular rhythm.  No murmurs, rubs, or gallops. Respiratory: Normal respiratory effort without tachypnea nor retractions. Breath sounds are clear and equal bilaterally. No wheezes/rales/rhonchi. Gastrointestinal: Soft. No distention, no guarding, no rebound. Mild tenderness diffusely, more moderate in the epigastrium and left upper quadrant.  Genitourinary/rectal:Deferred Musculoskeletal: Nontender with normal range of motion in all extremities. No joint effusions.   No lower extremity tenderness.  No edema. Neurologic:  Normal speech and language. No gross or focal neurologic deficits are appreciated. Skin:  Skin is warm, dry and intact. No rash noted. Psychiatric: Mood and affect are normal. Speech and behavior are normal. Patient exhibits appropriate insight and judgment.   ____________________________________________  LABS (pertinent positives/negatives)  Labs Reviewed  COMPREHENSIVE METABOLIC PANEL - Abnormal; Notable for the following:       Result Value   Potassium 3.2 (*)    All other components within normal limits  URINALYSIS, COMPLETE (UACMP) WITH MICROSCOPIC - Abnormal; Notable for the following:    Color, Urine YELLOW (*)    APPearance HAZY (*)    Ketones, ur 5 (*)    Squamous Epithelial / LPF 0-5 (*)    All other components within normal limits  GASTROINTESTINAL PANEL BY PCR, STOOL (REPLACES STOOL CULTURE)  LIPASE, BLOOD  CBC  POCT PREGNANCY, URINE  POC URINE PREG, ED    ____________________________________________    EKG I, Governor Rooks, MD, the attending physician have personally viewed and interpreted all ECGs.  81 bpm. Normal sinus rhythm. Narrow QRS. Normal axis. Normal ST and T-wave ____________________________________________  RADIOLOGY All Xrays were viewed by me. Imaging interpreted by Radiologist.  None __________________________________________  PROCEDURES  Procedure(s) performed: None  Critical Care performed: None  ____________________________________________   ED COURSE / ASSESSMENT AND PLAN  Pertinent labs & imaging results that were available during my care of the patient were reviewed by me and considered in my medical decision making (see chart for details).   Her main complaint is diarrhea for 4 days associated with epigastric and left upper quadrant abdominal pain.  Her vitals and abdominal exam are overall reassuring as is her laboratory studies with a normal white blood cell count.  She does have mild hypokalemia.  I am going to give her a dose of morphine and Zofran and back of fluid and await urinalysis results.  Patient declined/deferred pelvic exam given no pelvic symptoms.  If she has a stool here I will send it off for analysis.  We discussed that at this point would see how she does before determining whether or not to use CT imaging. At this point I feel like if she has some symptomatic relief, with reassuring laboratory studies an abdominal exam, I would hold off.   Reevaluation around 12:45 PM, patient feeling improved after fluids and symptomatic medications. She still has some discomfort when pressed on her abdomen, but I discussed with her again whether or not to pursue CT, and she would like to hold off. It is completely reasonable.    CONSULTATIONS:   None   Patient / Family / Caregiver informed of clinical course, medical decision-making process, and agree with plan.   I discussed return precautions, follow-up instructions, and discharge instructions with patient and/or family.  Discharge Instructions : You were evaluated for diarrhea and abdominal cramping, and although no certain cause was found, examine evaluation are reassuring in the emergency department stay.  Return to the  emergency room immediately for any fever, black or bloody stools, worsening or uncontrolled abdominal pain, or any other symptoms concerning to you.  ___________________________________________   FINAL CLINICAL IMPRESSION(S) / ED DIAGNOSES   Final diagnoses:  Diarrhea, unspecified type  Generalized abdominal pain              Note: This dictation was prepared with Dragon dictation. Any transcriptional errors that result from this process are unintentional    Governor Rooks, MD 03/19/17 1250

## 2017-03-19 NOTE — Discharge Instructions (Signed)
You were evaluated for diarrhea and abdominal cramping, and although no certain cause was found, examine evaluation are reassuring in the emergency department stay.  Return to the emergency room immediately for any fever, black or bloody stools, worsening or uncontrolled abdominal pain, or any other symptoms concerning to you.

## 2017-03-19 NOTE — ED Notes (Signed)
Pt verbalized understanding of discharge instructions. NAD at this time. 

## 2017-03-19 NOTE — ED Triage Notes (Signed)
Pt arrived via POV from home with reports of upper abdominal pain for the past 4 days, pt also states she has been having diarrhea which has been worsening since 4 days ago. Denies any vomiting. C/o nausea.

## 2017-03-19 NOTE — ED Notes (Signed)
Pt unable to have BM no gastro panel obtained during visit.

## 2017-10-05 ENCOUNTER — Emergency Department: Payer: Commercial Managed Care - PPO

## 2017-10-05 ENCOUNTER — Encounter: Payer: Self-pay | Admitting: Emergency Medicine

## 2017-10-05 ENCOUNTER — Other Ambulatory Visit: Payer: Self-pay

## 2017-10-05 ENCOUNTER — Emergency Department
Admission: EM | Admit: 2017-10-05 | Discharge: 2017-10-05 | Disposition: A | Discharge status: Home / Self Care | Payer: Commercial Managed Care - PPO | Attending: Emergency Medicine | Admitting: Emergency Medicine

## 2017-10-05 DIAGNOSIS — I10 Essential (primary) hypertension: Secondary | ICD-10-CM | POA: Insufficient documentation

## 2017-10-05 DIAGNOSIS — J069 Acute upper respiratory infection, unspecified: Secondary | ICD-10-CM

## 2017-10-05 DIAGNOSIS — R05 Cough: Secondary | ICD-10-CM | POA: Diagnosis present

## 2017-10-05 HISTORY — DX: Essential (primary) hypertension: I10

## 2017-10-05 LAB — BASIC METABOLIC PANEL
ANION GAP: 9 (ref 5–15)
BUN: 11 mg/dL (ref 6–20)
CALCIUM: 9.5 mg/dL (ref 8.9–10.3)
CO2: 26 mmol/L (ref 22–32)
Chloride: 103 mmol/L (ref 101–111)
Creatinine, Ser: 0.68 mg/dL (ref 0.44–1.00)
Glucose, Bld: 139 mg/dL — ABNORMAL HIGH (ref 65–99)
POTASSIUM: 3.4 mmol/L — AB (ref 3.5–5.1)
SODIUM: 138 mmol/L (ref 135–145)

## 2017-10-05 LAB — CBC
HEMATOCRIT: 35.7 % (ref 35.0–47.0)
HEMOGLOBIN: 12 g/dL (ref 12.0–16.0)
MCH: 30.2 pg (ref 26.0–34.0)
MCHC: 33.4 g/dL (ref 32.0–36.0)
MCV: 90.2 fL (ref 80.0–100.0)
Platelets: 275 10*3/uL (ref 150–440)
RBC: 3.96 MIL/uL (ref 3.80–5.20)
RDW: 15 % — ABNORMAL HIGH (ref 11.5–14.5)
WBC: 6.5 10*3/uL (ref 3.6–11.0)

## 2017-10-05 LAB — INFLUENZA PANEL BY PCR (TYPE A & B)
INFLAPCR: NEGATIVE
INFLBPCR: NEGATIVE

## 2017-10-05 LAB — TROPONIN I

## 2017-10-05 MED ORDER — GUAIFENESIN-CODEINE 100-10 MG/5ML PO SOLN: 5.0000 mL | Freq: Four times a day (QID) | ORAL | 0 refills | Status: DC | PRN

## 2017-10-05 MED ORDER — HYDROCOD POLST-CPM POLST ER 10-8 MG/5ML PO SUER
5.0000 mL | Freq: Once | ORAL | Status: AC
  Administered 2017-10-05: 5 mL via ORAL
  Filled 2017-10-05: qty 5

## 2017-10-05 NOTE — ED Notes (Signed)
Pt c/o sore throat, generalized body aches and chills. Recent ear infection. Pt ambulatory

## 2017-10-05 NOTE — ED Provider Notes (Signed)
[facility] Emergency Department Provider Note  Time seen: 2:53 PM  I have reviewed the triage vital signs and the nursing notes.   HISTORY  Chief Complaint Otalgia; Sore Throat; Cough; and Hypertension    HPI Sheila Banks is a 47 y.o. female with a past medical history of hypertension who presents to the emergency department for cough, congestion, ear pain and sore throat.  According to the patient for the past 2 weeks she has had cough with occasional sputum production, nasal congestion, sore throat, initially with a fever.  States she saw her doctor was prescribed steroids cough medication and antibiotics.  Has finished all of her medications but continues to have cough with sore throat and ear pain so she came to the emergency department for evaluation.  No fever today but states she had a fever over the last several days.   Past Medical History:  Diagnosis Date  . Hypertension     There are no active problems to display for this patient.   Past Surgical History:  Procedure Laterality Date  . BACK SURGERY  02/2017    Prior to Admission medications   Medication Sig Start Date End Date Taking? Authorizing Provider  acetaminophen (TYLENOL) 500 MG tablet Take 1,000 mg by mouth every 6 (six) hours as needed.   Yes [provider]  baclofen (LIORESAL) 10 MG tablet Take 1 tablet (10 mg total) by mouth 3 (three) times daily. Patient not taking: Reported on 10/05/2017 01/24/16   Evangeline Dakin, PA-C  dicyclomine (BENTYL) 10 MG capsule Take 1 capsule (10 mg total) by mouth 3 (three) times daily as needed for spasms. 01/03/17 01/17/17  Willy Eddy, MD  ibuprofen (ADVIL,MOTRIN) 800 MG tablet Take 1 tablet (800 mg total) by mouth every 8 (eight) hours as needed. Patient not taking: Reported on 10/05/2017 01/24/16   Evangeline Dakin, PA-C  oxyCODONE-acetaminophen (ROXICET) 5-325 MG tablet Take 1-2 tablets by mouth every 4 (four) hours as needed for  severe pain. Patient not taking: Reported on 10/05/2017 01/24/16   Beers, Charmayne Sheer, PA-C  promethazine (PHENERGAN) 12.5 MG tablet Take 1 tablet (12.5 mg total) by mouth every 6 (six) hours as needed for nausea or vomiting. Patient not taking: Reported on 10/05/2017 01/03/17   Willy Eddy, MD    Allergies  Allergen Reactions  . Penicillins Hives    Has patient had a PCN reaction causing immediate rash, facial/tongue/throat swelling, SOB or lightheadedness with hypotension: Yes Has patient had a PCN reaction causing severe rash involving mucus membranes or skin necrosis: No Has patient had a PCN reaction that required hospitalization: No Has patient had a PCN reaction occurring within the last 10 years: No If all of the above answers are "NO", then may proceed with Cephalosporin use.    No family history on file.  Social History Social History   Tobacco Use  . Smoking status: Never Smoker  . Smokeless tobacco: Never Used  Substance Use Topics  . Alcohol use: No  . Drug use: No    Review of Systems Constitutional: Fever several days ago Eyes: Negative for visual complaints ENT: Positive for congestion.  Positive for sore throat.  Positive for ear pain, bilateral. Cardiovascular: Negative for chest pain. Respiratory: Positive for shortness of breath with cough. Gastrointestinal: Negative for abdominal pain Genitourinary: Negative for urinary compaints Musculoskeletal: Negative for leg pain or swelling. Skin: Negative for skin complaints  Neurological: Moderate headache worse with cough. All other ROS negative  ____________________________________________  PHYSICAL EXAM:  VITAL SIGNS: ED Triage Vitals  Enc Vitals Group     BP 10/05/17 1113 (!) 143/93     Pulse Rate 10/05/17 1113 97     Resp 10/05/17 1113 16     Temp 10/05/17 1113 99 F (37.2 C)     Temp Source 10/05/17 1113 Oral     SpO2 10/05/17 1113 100 %     Weight 10/05/17 1115 200 lb (90.7 kg)     Height  10/05/17 1115 5\' 3"  (1.6 m)     Head Circumference --      Peak Flow --      Pain Score 10/05/17 1114 9     Pain Loc --      Pain Edu? --      Excl. in GC? --    Constitutional: Alert and oriented. Well appearing and in no distress. Eyes: Normal exam ENT   Head: Normocephalic and atraumatic.  Normal tympanic membranes.   Nose: Mild rhinorrhea.   Mouth/Throat: Mucous membranes are moist.  Normal pharynx, no erythema or exudate. Cardiovascular: Normal rate, regular rhythm. No murmur Respiratory: Normal respiratory effort without tachypnea nor retractions. Breath sounds are clear.  No wheeze rales or rhonchi. Gastrointestinal: Soft and nontender. No distention Musculoskeletal: Nontender with normal range of motion in all extremities. No lower extremity tenderness or edema. Neurologic:  Normal speech and language. No gross focal neurologic deficits Skin:  Skin is warm, dry and intact.  Psychiatric: Mood and affect are normal.   ____________________________________________    EKG  EKG reviewed and interpreted by myself shows normal sinus rhythm at 94 bpm with a narrow QRS, normal axis, normal intervals, nonspecific but no concerning ST changes.  ____________________________________________    RADIOLOGY  Chest x-ray negative  ____________________________________________   INITIAL IMPRESSION / ASSESSMENT AND PLAN / ED COURSE  Pertinent labs & imaging results that were available during my care of the patient were reviewed by me and considered in my medical decision making (see chart for details).  Patient presents to the emergency department for continued cough congestion ear pain and sore throat over the past 2 weeks.  Differential would include upper respiratory infection, viral infection, otitis media, pharyngitis.  On exam overall the patient appears very well, normal vitals, no pharyngeal erythema, normal tympanic membranes, mild rhinorrhea and occasional dry cough.   Labs are reassuring, chest x-ray is normal, EKG is reassuring.  Suspect likely upper respiratory infection/viral infection.  Influenza test is negative.  Patient is already undergone a course of antibiotics as well as steroids.  We will refill the patient's cough medication have her follow-up with her doctor.  Patient agreeable to this plan of care.  ____________________________________________   FINAL CLINICAL IMPRESSION(S) / ED DIAGNOSES  Upper respiratory infection    Minna AntisPaduchowski, [place], MD 10/05/17 [phone]

## 2017-10-05 NOTE — ED Triage Notes (Signed)
Says she was put on blood pressure pills few weeks ago.  Also antibiotics and prednisone for ear infection.  Now has developed cough, feels like something haning in throat.  Pain in left ear to shoulder and back of head.  Says she cant catch her breath.  Left work as they told her bp was 162/104.

## 2017-10-05 NOTE — ED Notes (Signed)
Pt verbalizes d/c understnainding and follow up. Pt in NAD at time of departure, VS stable, pt ambulatory. Pt waiting on mother in lobby

## 2018-03-15 ENCOUNTER — Encounter: Payer: Self-pay | Admitting: Gastroenterology

## 2018-07-23 ENCOUNTER — Encounter: Payer: Self-pay | Admitting: Emergency Medicine

## 2018-07-23 ENCOUNTER — Ambulatory Visit
Admission: EM | Admit: 2018-07-23 | Discharge: 2018-07-23 | Disposition: A | Discharge status: Home / Self Care | Payer: Commercial Managed Care - PPO | Attending: Emergency Medicine | Admitting: Emergency Medicine

## 2018-07-23 ENCOUNTER — Other Ambulatory Visit: Payer: Self-pay

## 2018-07-23 DIAGNOSIS — R1011 Right upper quadrant pain: Secondary | ICD-10-CM | POA: Diagnosis not present

## 2018-07-23 DIAGNOSIS — L501 Idiopathic urticaria: Secondary | ICD-10-CM | POA: Diagnosis not present

## 2018-07-23 MED ORDER — MONTELUKAST SODIUM 10 MG PO TABS: 10.0000 mg | ORAL_TABLET | Freq: Every day | ORAL | 0 refills | Status: AC

## 2018-07-23 MED ORDER — DEXAMETHASONE SODIUM PHOSPHATE 10 MG/ML IJ SOLN
10.0000 mg | Freq: Once | INTRAMUSCULAR | Status: AC
  Administered 2018-07-23: 10 mg via INTRAMUSCULAR

## 2018-07-23 MED ORDER — PREDNISONE 20 MG PO TABS: ORAL_TABLET | ORAL | 0 refills | Status: AC

## 2018-07-23 NOTE — ED Triage Notes (Signed)
Patient in today c/o hives x 5 months, worse at night. Patient has seen PCP and an allergist. Patient states the hives hurt and can't sleep at night.

## 2018-07-23 NOTE — Discharge Instructions (Addendum)
Finish a 12-day prednisone taper and with your primary care physician tells you to stop.  Continue the famotidine 20 mg twice a day.  Or you can take 2 pills at once.  Try some Claritin and then take the Atarax as needed.  I am also going to try Singulair.  go immediately to the ER for the signs and symptoms we discussed including uncontrollable abdominal pain, difficulty breathing.  Follow-up with Dr. Elenore RotaJuengel as scheduled and follow-up with Dr. Maximino Greenlandahiliani, GI as soon as you possibly can.

## 2018-07-23 NOTE — ED Provider Notes (Signed)
HPI  SUBJECTIVE:  Sheila Banks is a 47 y.o. female who presents with migratory, intensely itchy, painful urticaria for the past 6 months.  States it is worse at night.  States that her lips and eyes are swollen in the mornings.  States that the urticaria is over her entire body, including her scalp, soles of feet, palms of hands.  She states that she is unable to sleep secondary to itching every night.  She also reports sharp chest pain, sharp right upper quadrant pain that occasionally migrates to her back and left side preceding the urticaria and angioedema.  Chest and abdominal pain last about 10 to 15 minutes.  She reports occasional nausea.  She reports one episode of emesis on Saturday.  She reports shortness of breath, occasional wheezing and states that her throat sometimes feels as if it is swelling shut.  No diarrhea, syncope.  She states that she has been seen by [facility] at the end of August, and they did not find any food, environmental allergies.  States that lupus and meat allergy testing was negative.  She has been taking Pepcid 20 mg p.o. twice daily, started on Atarax, and has been on several steroid tapers.  She states the steroids helped the most.  No aggravating factors.  Her symptoms are not associated with eating, drinking, movement, exercise.  Her PMD discontinued the lisinopril 5 weeks ago, with no resolution of the angioedema and the urticaria.  Patient also states that she has been seen at several urgent cares for this.  She has an appointment with Dr. Elenore Rota, ENT for an endoscopy in mid December.  No new lotions, soaps, detergents, foods, medications.  The symptoms happen whether she eats red meat or not.  No tick bites in the past year.  No new bed clothes, blankets.  No sensation of being bitten at night, no blood on the bed clothes in the morning.  Patient states that she has checked for bedbugs and has not found any.  She states that she has an EpiPen at home.  She  has a past medical history of hypertension for which she takes hydrochlorothiazide.  Was started on hydrochlorothiazide 2 weeks ago.  No history of gallbladder disease, pancreatitis, abdominal surgeries, asthma, allergies, diabetes.  History negative for angioedema.  LMP: She is on Depo-Provera.  Denies the possibility of being pregnant and states that we do not need to check.  AVW:UJWJX, Doralee Albino, MD   Past Medical History:  Diagnosis Date  . Hypertension     Past Surgical History:  Procedure Laterality Date  . BACK SURGERY  02/2017    Family History  Problem Relation Age of Onset  . Hypertension Mother   . Stroke Father     Social History   Tobacco Use  . Smoking status: Never Smoker  . Smokeless tobacco: Never Used  Substance Use Topics  . Alcohol use: No  . Drug use: No    No current facility-administered medications for this encounter.   Current Outpatient Medications:  .  acetaminophen (TYLENOL) 500 MG tablet, Take 1,000 mg by mouth every 6 (six) hours as needed., Disp: , Rfl:  .  citalopram (CELEXA) 20 MG tablet, Take 20 mg by mouth daily., Disp: , Rfl:  .  famotidine (PEPCID) 20 MG tablet, Take 20 mg by mouth 2 (two) times daily., Disp: , Rfl:  .  hydrochlorothiazide (HYDRODIURIL) 25 MG tablet, Take 25 mg by mouth daily., Disp: , Rfl:  .  hydrOXYzine (  ATARAX/VISTARIL) 25 MG tablet, Take 25 mg by mouth every 6 (six) hours as needed for itching., Disp: , Rfl:  .  montelukast (SINGULAIR) 10 MG tablet, Take 1 tablet (10 mg total) by mouth at bedtime., Disp: 30 tablet, Rfl: 0 .  predniSONE (DELTASONE) 20 MG tablet, 3 Tabs PO Days 1-3, then 2 tabs PO Days 4-6, then 1 tab PO Day 7-9, then Half Tab PO Day 10-12, Disp: 20 tablet, Rfl: 0 .  VENTOLIN HFA 108 (90 Base) MCG/ACT inhaler, 1-2 PUFFS AS NEEDED EVERY 4-6 HOURS AS NEEDED COUGH/WHEEZE INHALATION 90 DAYS, Disp: , Rfl: 0  Allergies  Allergen Reactions  . Penicillins Hives    Has patient had a PCN reaction causing  immediate rash, facial/tongue/throat swelling, SOB or lightheadedness with hypotension: Yes Has patient had a PCN reaction causing severe rash involving mucus membranes or skin necrosis: No Has patient had a PCN reaction that required hospitalization: No Has patient had a PCN reaction occurring within the last 10 years: No If all of the above answers are "NO", then may proceed with Cephalosporin use.     ROS  As noted in HPI.   Physical Exam  BP (!) 138/97 (BP Location: Left Arm)   Pulse 92   Temp 98.2 F (36.8 C) (Oral)   Resp 16   Ht 5\' 3"  (1.6 m)   Wt 95.3 kg   SpO2 100%   BMI 37.20 kg/m   Constitutional: Well developed, well nourished, no acute distress Eyes:  EOMI, conjunctiva normal bilaterally HENT: Normocephalic, atraumatic,mucus membranes moist.  this is a patient's picture. she does not have angioedema here in the clinic.  Airway widely patent.     Respiratory: Normal inspiratory effort, lungs clear bilaterally.  Good air movement. Cardiovascular: Normal rate regular rhythm, no murmurs, rubs, gallops GI: Soft.  Positive right upper quadrant tenderness, negative Murphy, negative McBurney, no guarding, rebound.  Normal appearance, bowel sounds. skin    Positive urticaria over the back, thighs. Musculoskeletal: no deformities Neurologic: Alert & oriented x 3, no focal neuro deficits Psychiatric: Speech and behavior appropriate   ED Course   Medications  dexamethasone (DECADRON) injection 10 mg (10 mg Intramuscular Given 07/23/18 1857)    No orders of the defined types were placed in this encounter.   No results found for this or any previous visit (from the past 24 hour(s)). No results found.  ED Clinical Impression  Chronic idiopathic urticaria  Right upper quadrant abdominal pain   ED Assessment/Plan  Care everywhere records reviewed.  No recent visits to an allergist or her PCP available.  No allergy labs noted.  Wonder if this could be  mastocytosis or carcinoid, although I think carcinoid is low in the differential in the absence of diarrhea.  No evidence of surgical abdomen at this point in time.  Do not think that she needs to go to the ER.  There are no signs of anaphylaxis right now.  She has follow-up with Dr. Elenore RotaJuengel, ENT in a few weeks.  Is also currently also on Pepcid 20 milligrams twice daily already.  Dexamethasone 10 mg IM x1 here now.  We will start her on Claritin, 12-day prednisone taper, Singulair.  We will have her continue the Atarax as needed.  Referring to Dr. Norville Haggardahilani, GI on-call.  she is to use her EpiPen for any signs of anaphylaxis and go immediately to the ER.  Discussed signs and symptoms that should prompt return to the ER.  Patient agrees with plan.  On reevaluation after the dexamethasone, patient states that she feels slightly better.  Plan as above.  Meds ordered this encounter  Medications  . dexamethasone (DECADRON) injection 10 mg  . predniSONE (DELTASONE) 20 MG tablet    Sig: 3 Tabs PO Days 1-3, then 2 tabs PO Days 4-6, then 1 tab PO Day 7-9, then Half Tab PO Day 10-12    Dispense:  20 tablet    Refill:  0  . montelukast (SINGULAIR) 10 MG tablet    Sig: Take 1 tablet (10 mg total) by mouth at bedtime.    Dispense:  30 tablet    Refill:  0    *This clinic note was created using Scientist, clinical (histocompatibility and immunogenetics). Therefore, there may be occasional mistakes despite careful proofreading.   ?    Domenick Gong, MD 07/23/18 2117

## 2018-07-23 NOTE — ED Triage Notes (Signed)
Patient also c/o abdominal pain (RUQ) that moves and switches sides, worse at night x 5 months.

## 2019-02-16 IMAGING — US US ABDOMEN LIMITED
1 series · 14 of 25 positions shown · non-contrast
Comparison: Abdominal ultrasound on 02/10/2006

CLINICAL DATA: Right upper quadrant abdominal pain.

EXAM:
US ABDOMEN LIMITED - RIGHT UPPER QUADRANT

[Series 1: us abdomen limited · 0.22mm/px · 14 of 33 slices shown]
[im 1/33]
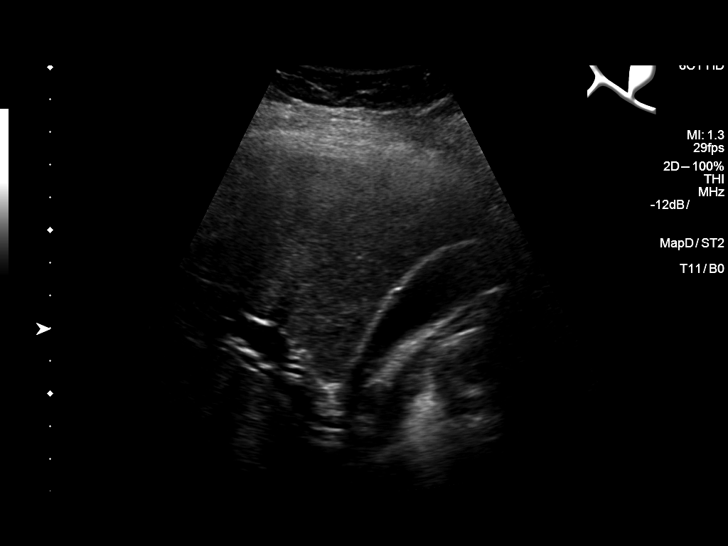
[im 3/33]
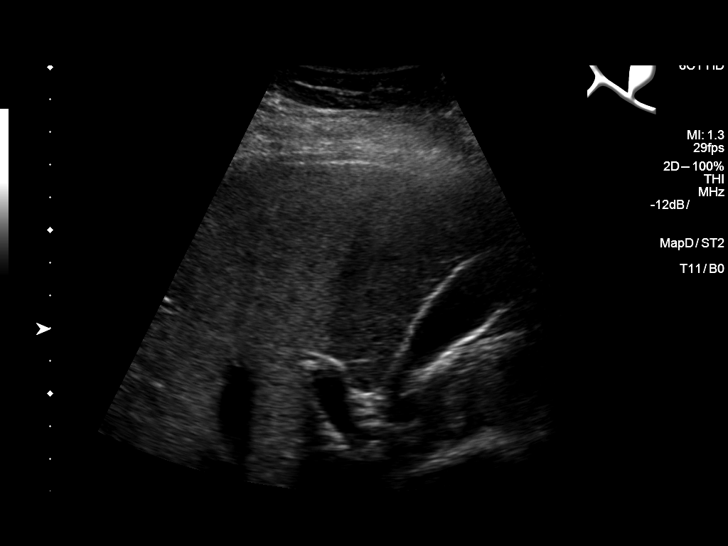
[im 6/33]
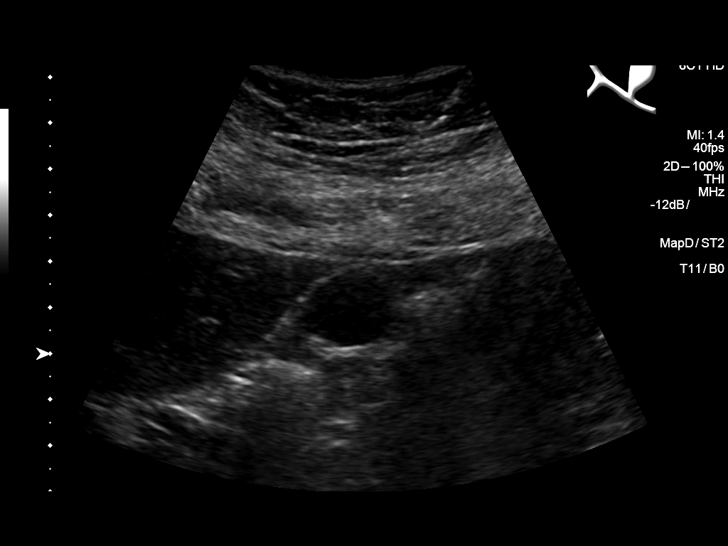
[im 9/33]
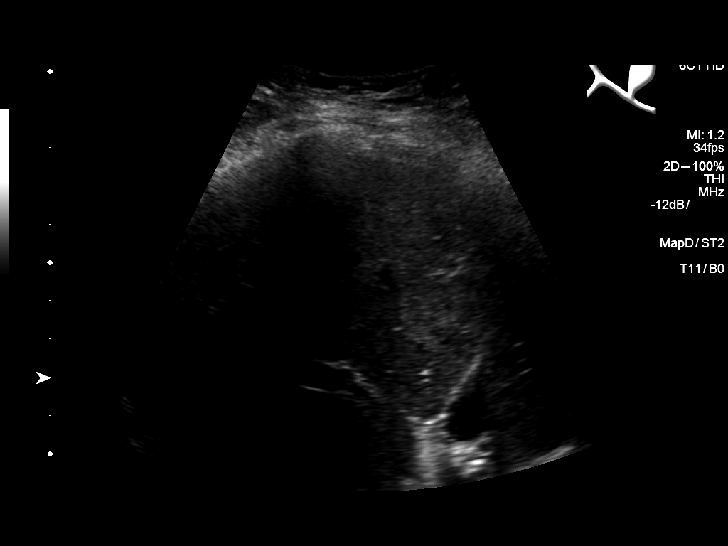
[im 11/33]
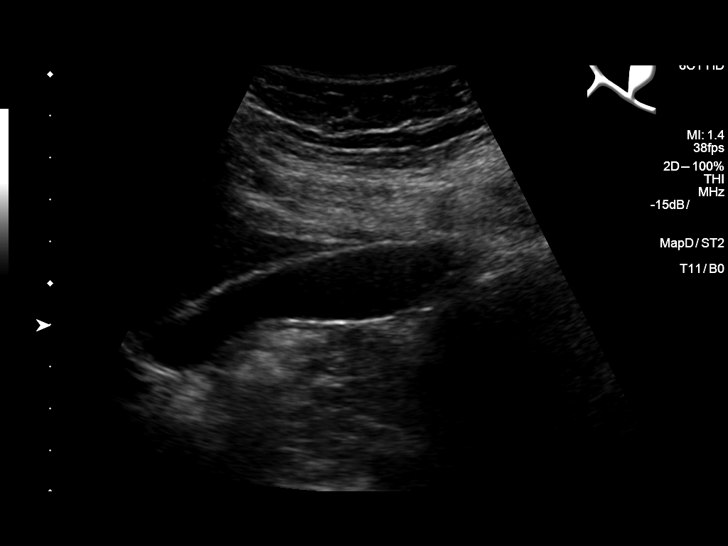
[im 13/33]
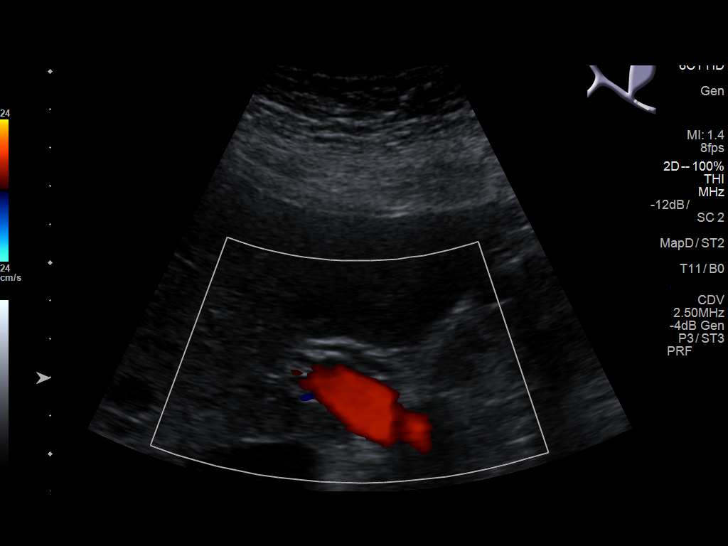
[im 15/33]
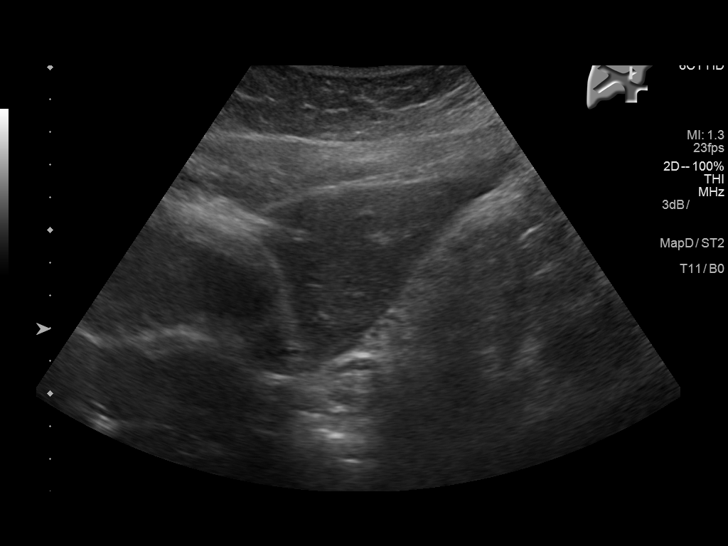
[im 18/33]
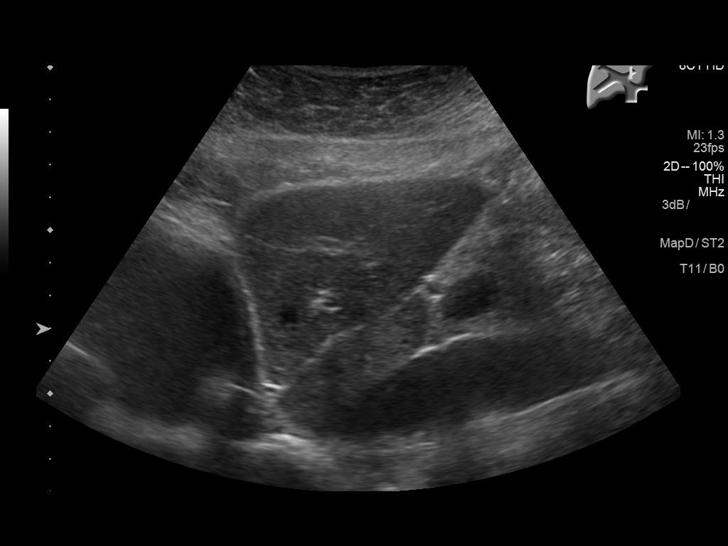
[im 21/33]
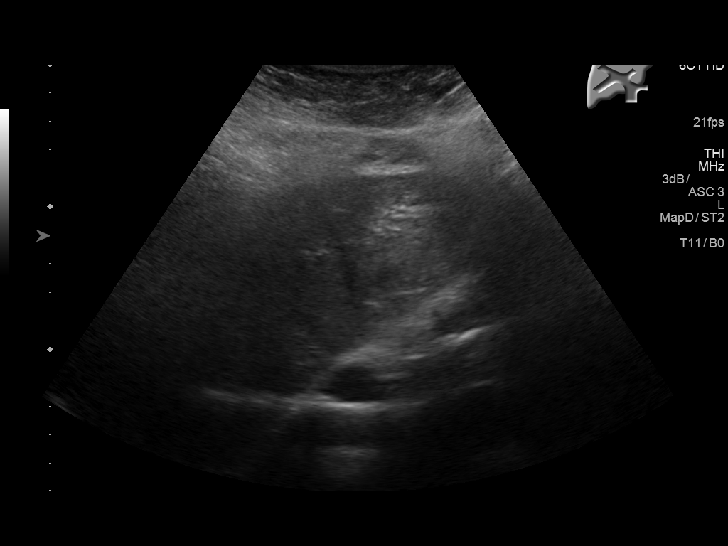
[im 22/33]
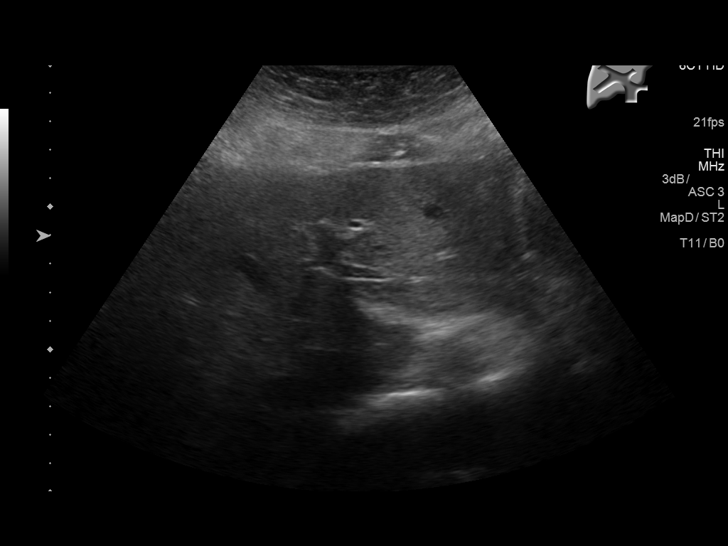
[im 25/33]
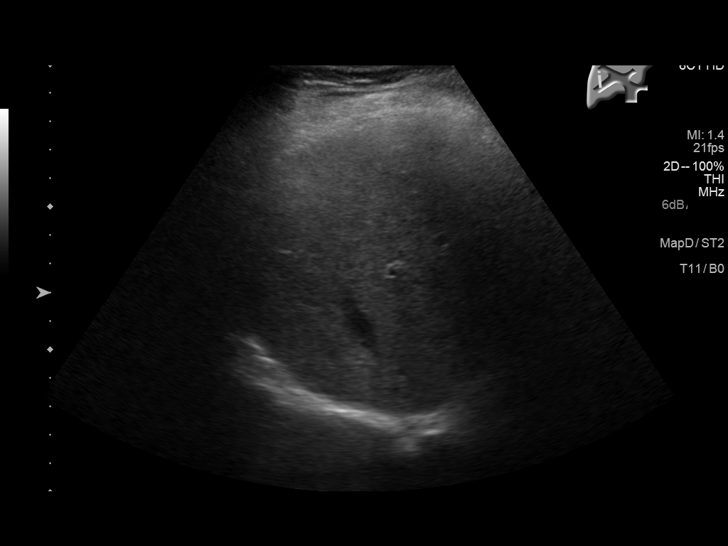
[im 27/33]
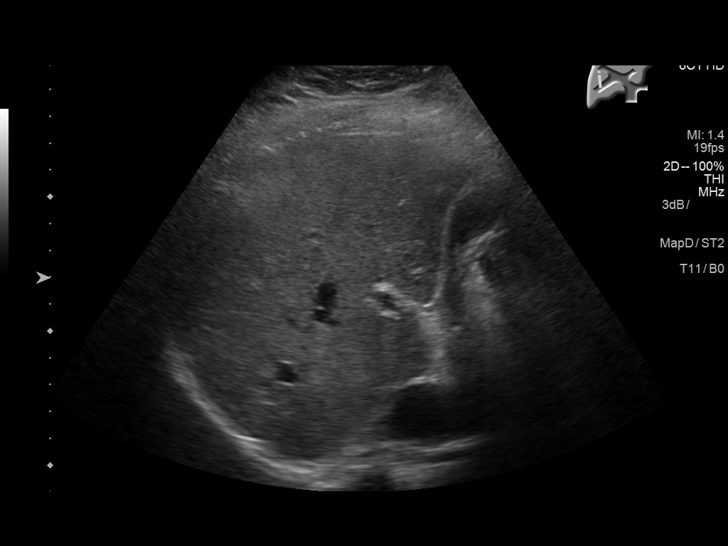
[im 30/33]
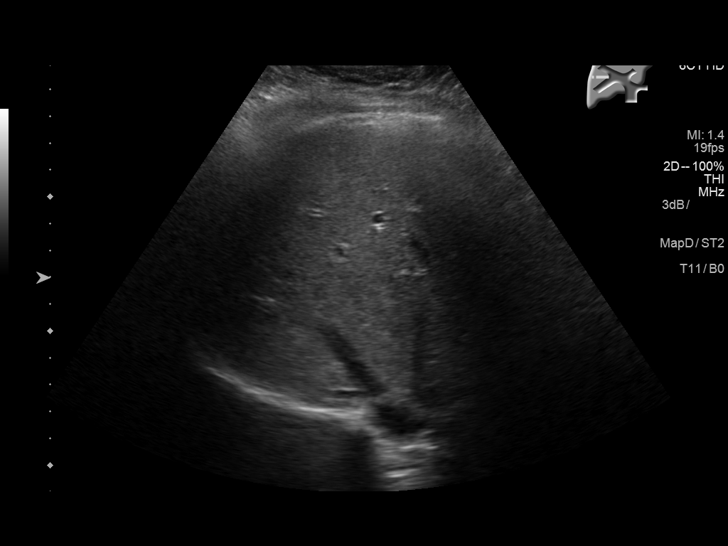
[im 33/33]
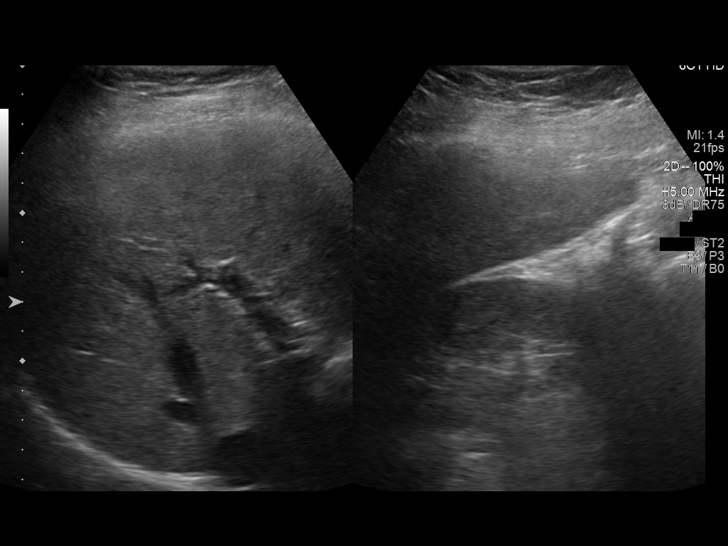

[14 of 25 positions shown; findings below may reference images not displayed]

FINDINGS: Gallbladder:

No gallstones or wall thickening visualized. No sonographic Murphy
sign noted by sonographer.

Common bile duct:

Diameter: 2 mm.

Liver:

No focal lesion identified. Within normal limits in parenchymal
echogenicity.
IMPRESSION: Unremarkable right upper quadrant ultrasound.

## 2019-04-09 ENCOUNTER — Other Ambulatory Visit: Payer: Self-pay | Admitting: Family Medicine

## 2019-04-09 DIAGNOSIS — Z1231 Encounter for screening mammogram for malignant neoplasm of breast: Secondary | ICD-10-CM

## 2019-06-16 IMAGING — CR DG CHEST 2V
2 series · 2 of 2 positions shown · non-contrast
Comparison: None.

CLINICAL DATA: Cough for 2 weeks with shortness of Breath

EXAM:
CHEST  2 VIEW

[chest pa]
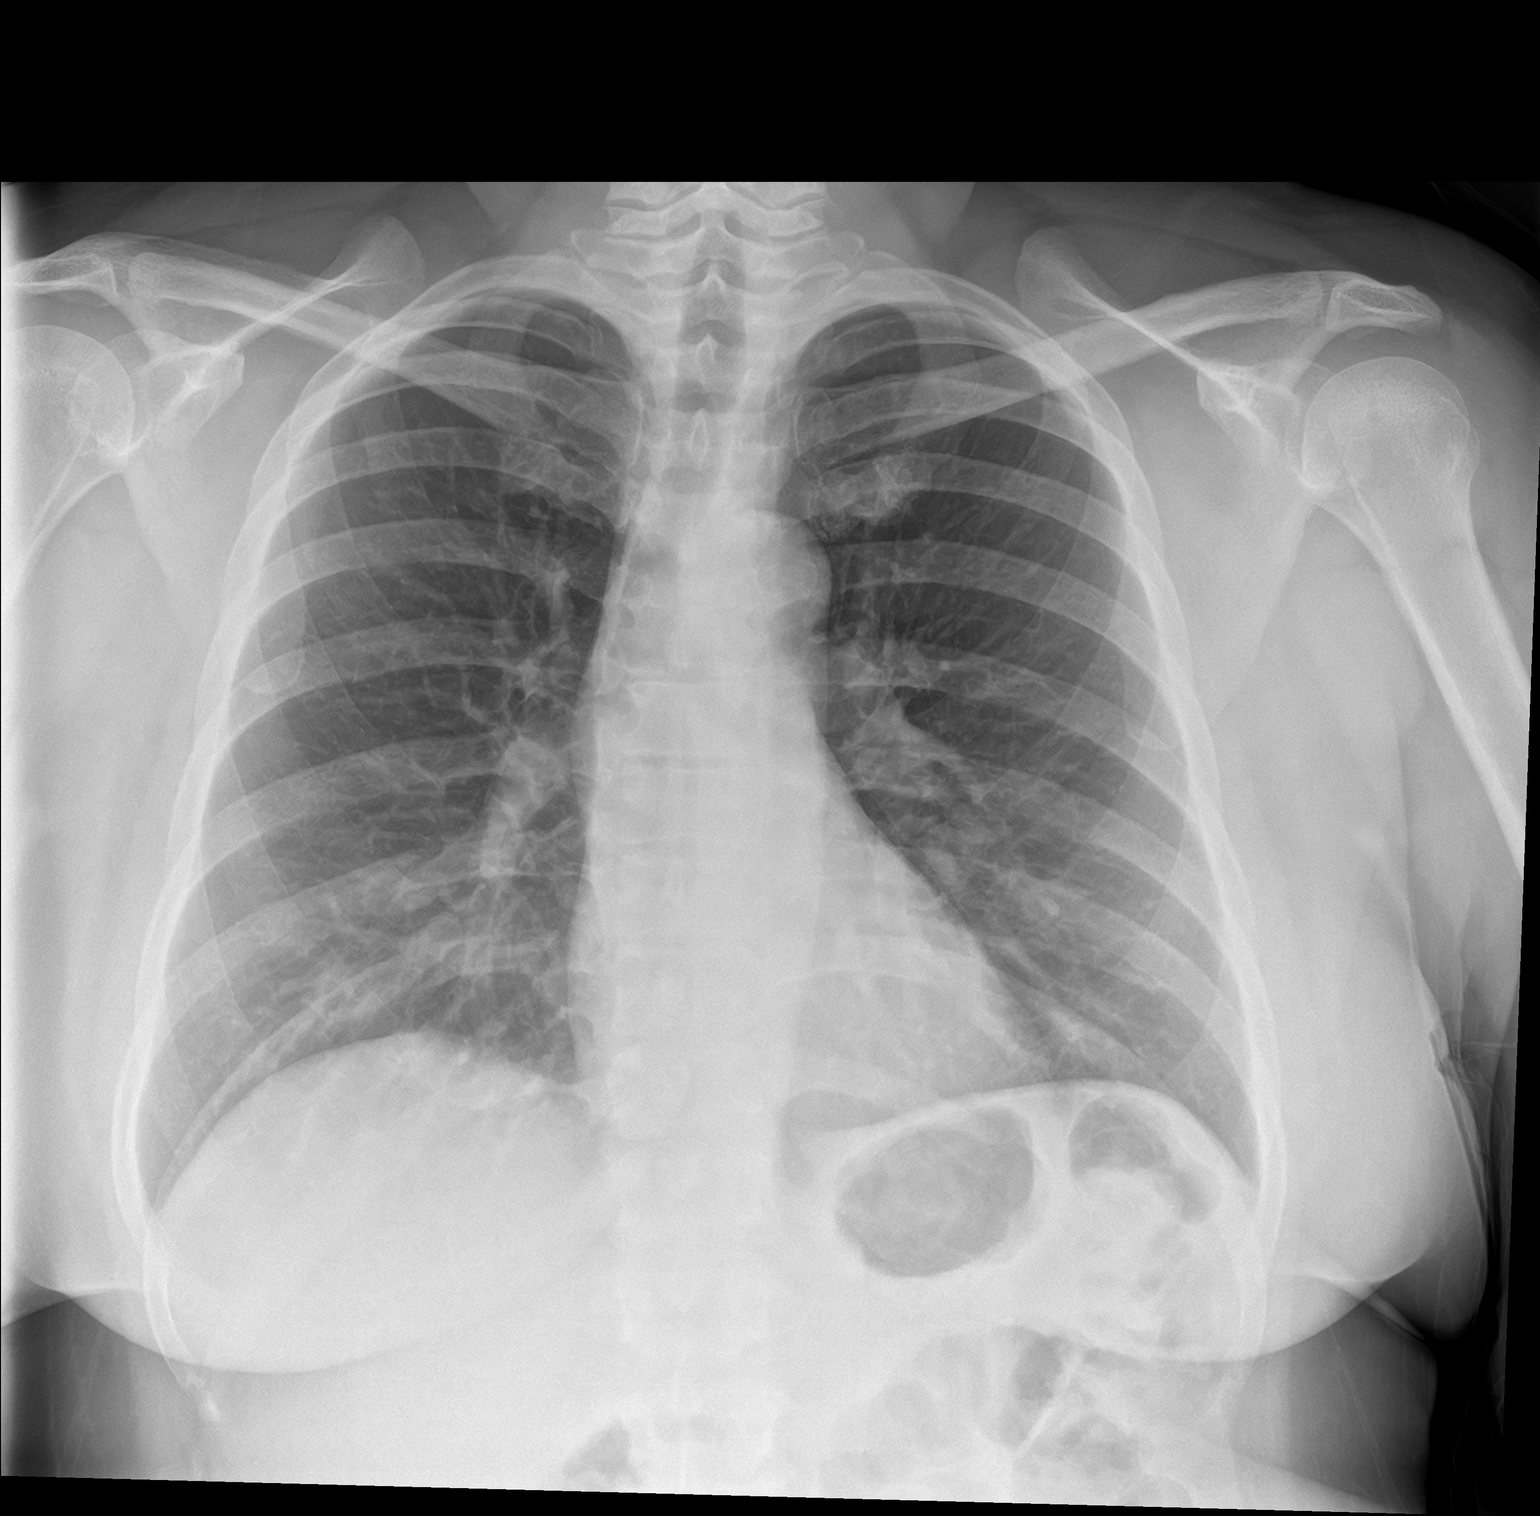

[chest lat]
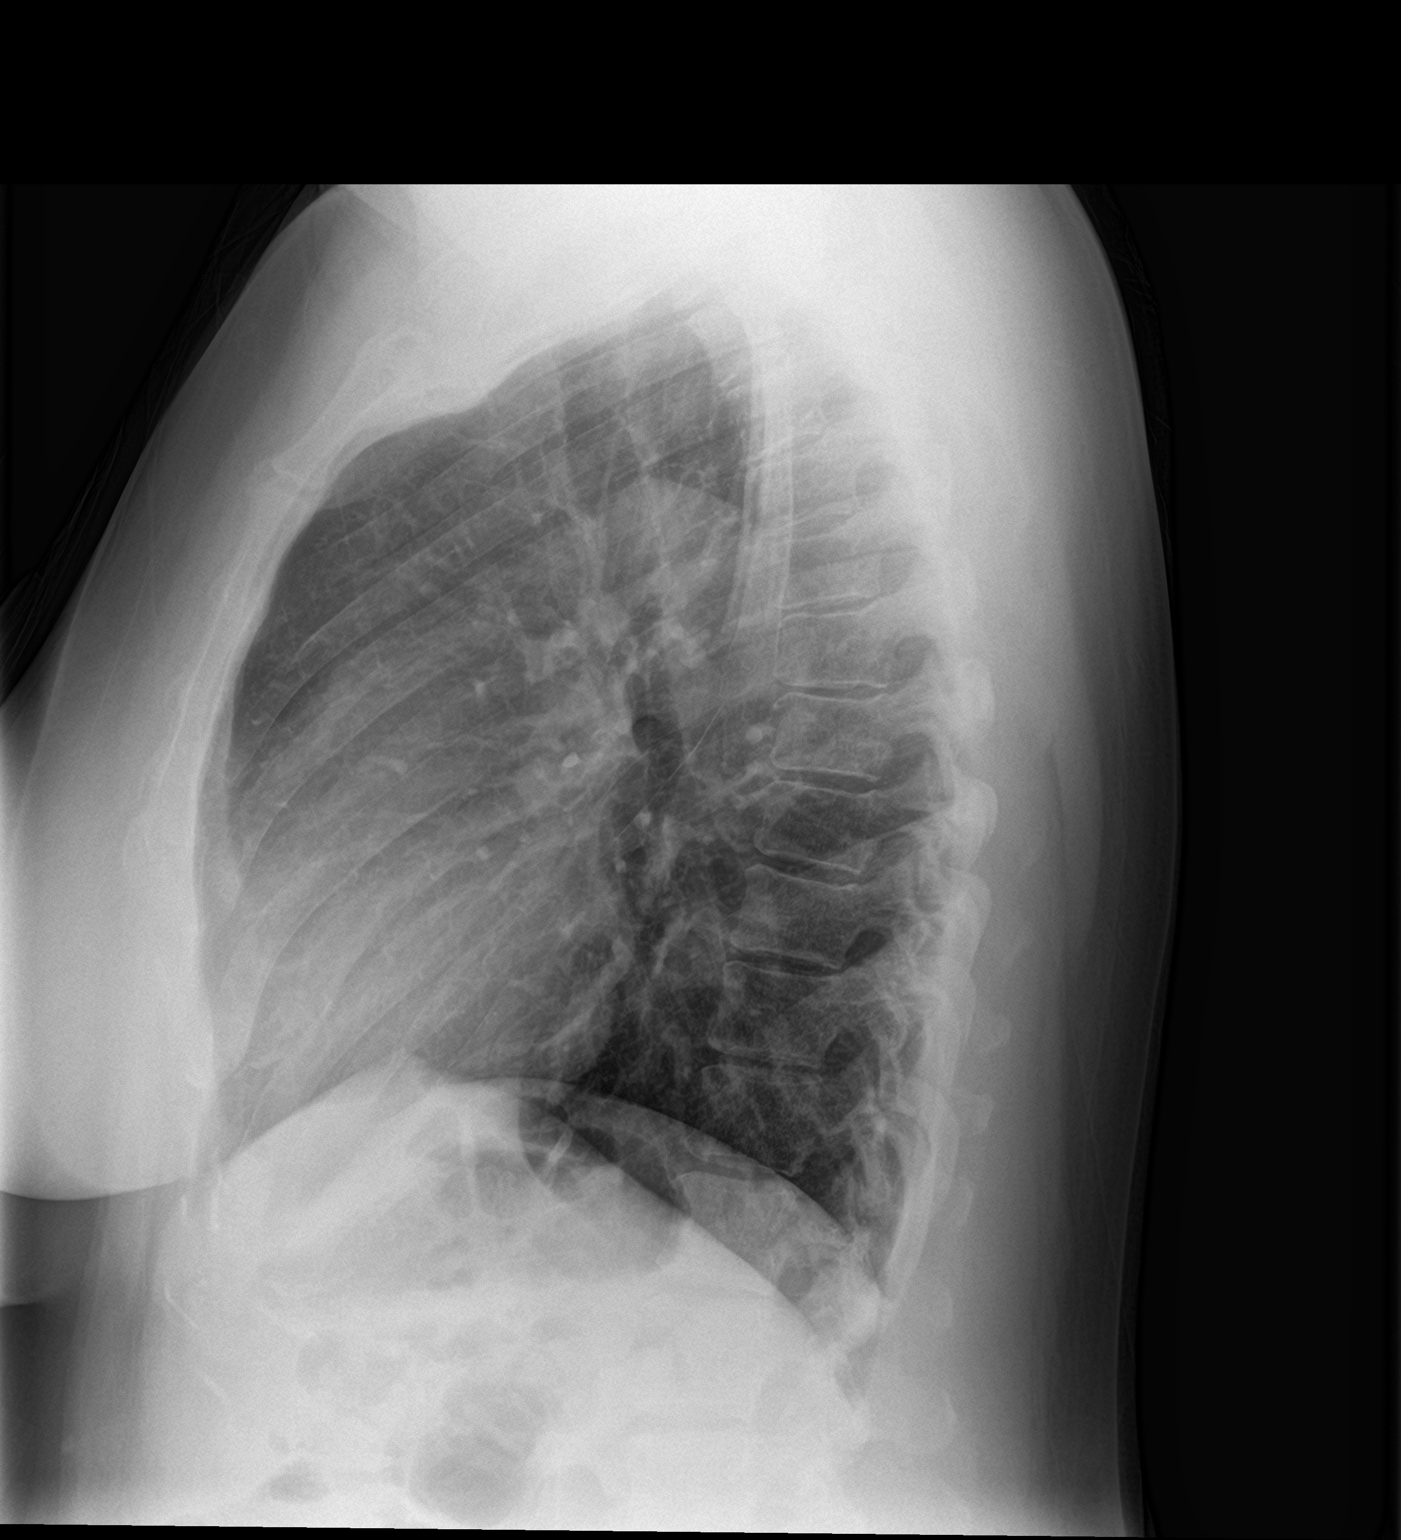

[2 of 2 positions shown; findings below may reference images not displayed]

FINDINGS: The heart size and mediastinal contours are within normal limits.
Both lungs are clear. The visualized skeletal structures are
unremarkable.
IMPRESSION: No active cardiopulmonary disease.

## 2019-07-16 ENCOUNTER — Other Ambulatory Visit: Payer: Self-pay

## 2019-07-16 DIAGNOSIS — Z20822 Contact with and (suspected) exposure to covid-19: Secondary | ICD-10-CM

## 2019-07-17 LAB — NOVEL CORONAVIRUS, NAA: SARS-CoV-2, NAA: NOT DETECTED

## 2019-10-26 ENCOUNTER — Ambulatory Visit: Payer: Commercial Managed Care - PPO | Attending: Internal Medicine

## 2019-10-26 ENCOUNTER — Ambulatory Visit: Payer: Commercial Managed Care - PPO

## 2019-10-26 DIAGNOSIS — Z23 Encounter for immunization: Secondary | ICD-10-CM

## 2019-10-26 NOTE — Progress Notes (Signed)
   Covid-19 Vaccination Clinic  Name:  Sheila Banks    MRN: [id number] DOB: January 27, 1971  10/26/2019  Ms. Sheila Banks was observed post Covid-19 immunization for 15 minutes without incidence. She was provided with Vaccine Information Sheet and instruction to access the V-Safe system.   Sheila Banks was instructed to call 911 with any severe reactions post vaccine: Marland Kitchen Difficulty breathing  . Swelling of your face and throat  . A fast heartbeat  . A bad rash all over your body  . Dizziness and weakness    Immunizations Administered    Name Date Dose VIS Date Route   Moderna COVID-19 Vaccine 10/26/2019  3:26 PM 0.5 mL 07/30/2019 Intramuscular   Manufacturer: Moderna   Lot: [id number]   NDC: 19914-445-84

## 2019-11-23 ENCOUNTER — Ambulatory Visit: Payer: Commercial Managed Care - PPO | Attending: Internal Medicine

## 2019-11-23 DIAGNOSIS — Z23 Encounter for immunization: Secondary | ICD-10-CM

## 2019-11-23 NOTE — Progress Notes (Signed)
   Covid-19 Vaccination Clinic  Name:  Sheila Banks    MRN: [id number] DOB: 09-Feb-1971  11/23/2019  [name] was observed post Covid-19 immunization for 15 minutes without incident. She was provided with Vaccine Information Sheet and instruction to access the V-Safe system.   [name] was instructed to call 911 with any severe reactions post vaccine: Marland Kitchen Difficulty breathing  . Swelling of face and throat  . A fast heartbeat  . A bad rash all over body  . Dizziness and weakness   Immunizations Administered    Name Date Dose VIS Date Route   Moderna COVID-19 Vaccine 11/23/2019 12:55 PM 0.5 mL 07/30/2019 Intramuscular   Manufacturer: Gala Murdoch   Lot: [id number]   NDC: 27035-009-38

## 2021-11-22 ENCOUNTER — Other Ambulatory Visit: Payer: Self-pay

## 2021-11-22 ENCOUNTER — Ambulatory Visit (INDEPENDENT_AMBULATORY_CARE_PROVIDER_SITE_OTHER): Payer: Commercial Managed Care - PPO | Admitting: Family Medicine

## 2021-11-22 ENCOUNTER — Ambulatory Visit: Payer: Commercial Managed Care - PPO

## 2021-11-22 ENCOUNTER — Encounter: Payer: Self-pay | Admitting: Family Medicine

## 2021-11-22 ENCOUNTER — Other Ambulatory Visit (HOSPITAL_COMMUNITY)
Admission: RE | Admit: 2021-11-22 | Discharge: 2021-11-22 | Disposition: A | Discharge status: Home / Self Care | Payer: Commercial Managed Care - PPO | Source: Ambulatory Visit | Attending: Obstetrics and Gynecology | Admitting: Obstetrics and Gynecology

## 2021-11-22 VITALS — BP 126/84 | Ht 63.0 in | Wt 236.0 lb

## 2021-11-22 DIAGNOSIS — R87810 Cervical high risk human papillomavirus (HPV) DNA test positive: Secondary | ICD-10-CM | POA: Insufficient documentation

## 2021-11-22 NOTE — Progress Notes (Signed)
? ?  GYNECOLOGY PROBLEM  VISIT ENCOUNTER NOTE ? ?Subjective:  ? Sheila Banks is a 51 y.o. female here for a problem GYN visit.  Current complaints: referral for abnormal pap.  NIL HPV positive this year. Reports no prior abnormal paps.   Denies abnormal vaginal bleeding, discharge, pelvic pain, problems with intercourse or other gynecologic concerns.  ?  ?Gynecologic History ?No LMP recorded. Patient has had an injection. ? ?Contraception: Depo-Provera injections and post menopausal status ? ?Health Maintenance Due  ?Topic Date Due  ? HIV Screening  Never done  ? Hepatitis C Screening  Never done  ? TETANUS/TDAP  Never done  ? PAP SMEAR-Modifier  Never done  ? COLONOSCOPY (Pts 45-59yrs Insurance coverage will need to be confirmed)  Never done  ? COVID-19 Vaccine (3 - Booster for Moderna series) 01/18/2020  ? MAMMOGRAM  Never done  ? Zoster Vaccines- Shingrix (1 of 2) Never done  ? INFLUENZA VACCINE  Never done  ? ? ?The following portions of the patient's history were reviewed and updated as appropriate: allergies, current medications, past family history, past medical history, past social history, past surgical history and problem list. ? ?Review of Systems ?Pertinent items are noted in HPI. ?  ?Objective:  ?BP 126/84   Ht 5\' 3"  (1.6 m)   Wt 236 lb (107 kg)   BMI 41.81 kg/m?  ?Gen: well appearing, NAD ?HEENT: no scleral icterus ?CV: RR ?Lung: Normal WOB ?Ext: warm well perfused ? ?PELVIC: Normal appearing external genitalia; normal appearing vaginal mucosa and cervix.  No abnormal discharge noted.   Normal uterine size, no other palpable masses, no uterine or adnexal tenderness. ? ? ? ?Patient gave informed written consent, time out was performed.  Placed in lithotomy position. Cervix viewed with speculum and colposcope after application of acetic acid.  ? ?Colposcopy adequate? Yes ? visible lesion(s) at 11 and 4-6 o'clock; corresponding biopsies obtained.  ECC specimen obtained. ?All specimens were  labeled and sent to pathology. ? ?Chaperone was present during entire procedure. ? ?  ? ?Assessment and Plan:  ? ?1. Cervical high risk HPV (human papillomavirus) test positive ?- Patient was given post procedure instructions.  Will follow up pathology and manage accordingly; patient will be contacted with results and recommendations.  Routine preventative ?- reviewed role of HPV in cervical cancer ?- Discussed that follow up is the most important factor ?- Surgical pathology ? ? ?Please refer to After Visit Summary for other counseling recommendations.  ? ?No follow-ups on file. ? ?Caren Macadam, MD, MPH, ABFM ?Attending Physician ?[facility] [place] ? ?

## 2021-11-23 LAB — SURGICAL PATHOLOGY

## 2021-11-26 ENCOUNTER — Telehealth: Payer: Self-pay

## 2021-11-26 NOTE — Telephone Encounter (Signed)
Pt aware of low grade changes and need to repeat pap in one year. ?

## 2021-11-26 NOTE — Telephone Encounter (Signed)
-----   Message from Federico Flake, MD sent at 11/24/2021  3:28 PM EDT ----- ?CIN1 present. Low level changes, recommend repeat pap in 1 year.   ? ?Please call patient is message is not seen.  ?

## 2022-01-06 ENCOUNTER — Ambulatory Visit
Admission: EM | Admit: 2022-01-06 | Discharge: 2022-01-06 | Disposition: A | Discharge status: Home / Self Care | Payer: Commercial Managed Care - PPO

## 2022-04-21 ENCOUNTER — Other Ambulatory Visit: Payer: Self-pay | Admitting: Family Medicine

## 2022-04-21 DIAGNOSIS — Z1231 Encounter for screening mammogram for malignant neoplasm of breast: Secondary | ICD-10-CM

## 2023-02-28 ENCOUNTER — Other Ambulatory Visit: Payer: Self-pay | Admitting: Family Medicine

## 2023-02-28 DIAGNOSIS — Z1231 Encounter for screening mammogram for malignant neoplasm of breast: Secondary | ICD-10-CM

## 2023-08-27 ENCOUNTER — Encounter: Payer: Self-pay | Admitting: Emergency Medicine

## 2023-08-27 ENCOUNTER — Ambulatory Visit
Admission: EM | Admit: 2023-08-27 | Discharge: 2023-08-27 | Disposition: A | Discharge status: Home / Self Care | Payer: BC Managed Care – PPO | Attending: Emergency Medicine | Admitting: Emergency Medicine

## 2023-08-27 DIAGNOSIS — I10 Essential (primary) hypertension: Secondary | ICD-10-CM | POA: Diagnosis not present

## 2023-08-27 DIAGNOSIS — R519 Headache, unspecified: Secondary | ICD-10-CM | POA: Insufficient documentation

## 2023-08-27 HISTORY — DX: Migraine, unspecified, not intractable, without status migrainosus: G43.909

## 2023-08-27 LAB — SARS CORONAVIRUS 2 BY RT PCR: SARS Coronavirus 2 by RT PCR: NEGATIVE

## 2023-08-27 NOTE — ED Provider Notes (Signed)
[facility]    CSN: [id number] Arrival date & time: 08/27/23  1514      History   Chief Complaint Chief Complaint  Patient presents with   Headache   Emesis    HPI Sheila Banks is a 52 y.o. female.   HPI  52 year old female with a past medical history significant for hypertension and migraines presents for evaluation of headache that started 2 to 3 days ago and is in both the frontal and occipital region of her scalp.  She states that the pain is a constant and currently rates it as an 8/10.  She developed nausea and vomiting today along with dizziness.  She denies any change in vision, syncope, or respiratory symptoms.  She states that she has not missed any doses of her blood pressure medication but she is hypertensive in clinic with a BP of 153/105.  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      Home Medications    Prior to Admission medications   Medication Sig Start Date End Date Taking? Authorizing Provider  hydrochlorothiazide (HYDRODIURIL) 25 MG tablet Take 25 mg by mouth daily.   Yes [provider]  acetaminophen (TYLENOL) 500 MG tablet Take 1,000 mg by mouth every 6 (six) hours as needed. Patient not taking: Reported on 11/22/2021    [provider]  citalopram (CELEXA) 20 MG tablet Take 20 mg by mouth daily. Patient not taking: Reported on 11/22/2021    [provider]  famotidine (PEPCID) 20 MG tablet Take 20 mg by mouth 2 (two) times daily. Patient not taking: Reported on 11/22/2021    [provider]  fluticasone (FLONASE) 50 MCG/ACT nasal spray Place 1 spray into both nostrils daily. 09/27/21   [provider]  hydrOXYzine (ATARAX/VISTARIL) 25 MG tablet Take 25 mg by mouth every 6 (six) hours as needed for itching. Patient not taking: Reported on 11/22/2021    [provider]  lisinopril (ZESTRIL) 10 MG tablet Take by mouth.    [provider]  medroxyPROGESTERone (DEPO-PROVERA) 150 MG/ML injection Inject 150 mg into the muscle every 3 (three) months.    [provider]  montelukast (SINGULAIR) 10 MG tablet Take 1 tablet (10 mg total) by mouth at bedtime. Patient not taking: Reported on 11/22/2021 07/23/18   Domenick Gong, MD  predniSONE (DELTASONE) 20 MG tablet 3 Tabs PO Days 1-3, then 2 tabs PO Days 4-6, then 1 tab PO Day 7-9, then Half Tab PO Day 10-12 Patient not taking: Reported on 11/22/2021 07/23/18   Domenick Gong, MD  promethazine (PHENERGAN) 25 MG tablet Take 25 mg by mouth 2 (two) times daily as needed. 09/16/21   [provider]  VENTOLIN HFA 108 (90 Base) MCG/ACT inhaler 1-2 PUFFS AS NEEDED EVERY 4-6 HOURS AS NEEDED COUGH/WHEEZE INHALATION 90 DAYS Patient not taking: Reported on 11/22/2021 04/24/18   [provider]    Family History Family History  Problem Relation Age of Onset   Hypertension Mother    Stroke Father  Social History Social History   Tobacco Use   Smoking status: Never   Smokeless tobacco: Never  Vaping Use   Vaping status: Never Used  Substance Use Topics   Alcohol use: No   Drug use: No     Allergies   Penicillins and Meloxicam   Review of Systems Review of Systems  HENT:  Negative for congestion, rhinorrhea and sinus pressure.   Eyes:  Negative for visual disturbance.  Gastrointestinal:  Positive for nausea and vomiting.  Neurological:  Positive for dizziness and headaches. Negative for facial asymmetry.     Physical Exam Triage Vital Signs ED Triage Vitals  Encounter  Vitals Group     BP 08/27/23 1603 (!) 153/105     Systolic BP Percentile --      Diastolic BP Percentile --      Pulse Rate 08/27/23 1603 85     Resp 08/27/23 1603 15     Temp 08/27/23 1603 98.1 F (36.7 C)     Temp Source 08/27/23 1603 Oral     SpO2 08/27/23 1603 98 %     Weight 08/27/23 1601 235 lb 14.3 oz (107 kg)     Height 08/27/23 1601 5\' 3"  (1.6 m)     Head Circumference --      Peak Flow --      Pain Score 08/27/23 1601 8     Pain Loc --      Pain Education --      Exclude from Growth Chart --    No data found.  Updated Vital Signs BP (!) 153/105 (BP Location: Left Arm)   Pulse 85   Temp 98.1 F (36.7 C) (Oral)   Resp 15   Ht 5\' 3"  (1.6 m)   Wt 235 lb 14.3 oz (107 kg)   LMP 08/20/2023 (Approximate)   SpO2 98%   BMI 41.79 kg/m   Visual Acuity Right Eye Distance:   Left Eye Distance:   Bilateral Distance:    Right Eye Near:   Left Eye Near:    Bilateral Near:     Physical Exam Vitals and nursing note reviewed.  Constitutional:      Appearance: She is well-developed. She is obese. She is not ill-appearing.  HENT:     Head: Normocephalic and atraumatic.     Mouth/Throat:     Mouth: Mucous membranes are moist.     Pharynx: Oropharynx is clear.  Eyes:     Extraocular Movements: Extraocular movements intact.     Pupils: Pupils are equal, round, and reactive to light.     Comments: Patient unable to perform EOM as it increases the pain in her head.  Cardiovascular:     Rate and Rhythm: Normal rate and regular rhythm.     Heart sounds: Normal heart sounds. No murmur heard.    No friction rub. No gallop.  Pulmonary:     Effort: Pulmonary effort is normal.     Breath sounds: Normal breath sounds. No wheezing, rhonchi or rales.  Neurological:     Mental Status: She is alert and oriented to person, place, and time.     GCS: GCS eye subscore is 4. GCS verbal subscore is 5. GCS motor subscore is 6.     Cranial Nerves: No cranial nerve deficit or facial  asymmetry.  Psychiatric:        Mood and Affect: Mood normal.        Speech: Speech normal.  Behavior: Behavior normal.      UC Treatments / Results  Labs (all labs ordered are listed, but only abnormal results are displayed) Labs Reviewed  SARS CORONAVIRUS 2 BY RT PCR    EKG   Radiology No results found.  Procedures Procedures (including critical care time)  Medications Ordered in UC Medications - No data to display  Initial Impression / Assessment and Plan / UC Course  I have reviewed the triage vital signs and the nursing notes.  Pertinent labs & imaging results that were available during my care of the patient were reviewed by me and considered in my medical decision making (see chart for details).   Patient is a pleasant 52 year old female presenting for evaluation of frontal and occipital headache that is been present for last 2 to 3 days with associated dizziness, nausea, and vomiting.  She does have a history of migraines but states that this does not feel like her typical migraine.  She describes intense pressure in her head but denies any respiratory symptoms.  Her upper respiratory tract is free of edema or erythema.  Also no discharge.  Oropharyngeal exam is benign.  Pupils are equal round and reactive though patient is light sensitive.  Performing EOM cause an increase in the patient's pain and she was able to track my finger due to the discomfort.  Her blood pressure is elevated at 153/105 in clinic and she reports that she has not missed any doses of her blood pressure medication.  Given that she is experiencing headache, dizziness, nausea vomiting, photophobia, and has elevated blood pressure I feel that she needs to be evaluated in the emergency department.  She has elected to go to [facility].  I do feel she is safe to travel via POV.  She left ambulatory in stable condition.   Final Clinical Impressions(s) / UC Diagnoses   Final diagnoses:  Bad  headache  Hypertension, unspecified type     Discharge Instructions      Please go to the emergency department at [facility] to be evaluated for your headache and elevated blood pressure.  Please go now.     ED Prescriptions   None    PDMP not reviewed this encounter.   Becky Augusta, NP 08/27/23 1650

## 2023-08-27 NOTE — Discharge Instructions (Addendum)
Please go to the emergency department at [facility] to be evaluated for your headache and elevated blood pressure.  Please go now.

## 2023-08-27 NOTE — ED Notes (Signed)
Patient is being discharged from the Urgent Care and sent to the [facility] Emergency Department via private vehicle . Per Becky Augusta, NP, patient is in need of higher level of care due to HA and vomiting. Patient is aware and verbalizes understanding of plan of care.  Vitals:   08/27/23 1603  BP: (!) 153/105  Pulse: 85  Resp: 15  Temp: 98.1 F (36.7 C)  SpO2: 98%

## 2023-08-27 NOTE — ED Triage Notes (Signed)
Patient c/o headache that started 2-3 days ago.  Patient reports vomiting today.  Patient denies any other cold symptoms.

## 2023-09-18 ENCOUNTER — Other Ambulatory Visit: Payer: Self-pay | Admitting: Family Medicine

## 2023-09-18 DIAGNOSIS — Z1231 Encounter for screening mammogram for malignant neoplasm of breast: Secondary | ICD-10-CM

## 2023-11-16 ENCOUNTER — Ambulatory Visit
Admission: EM | Admit: 2023-11-16 | Discharge: 2023-11-16 | Disposition: A | Discharge status: Home / Self Care | Attending: Emergency Medicine | Admitting: Emergency Medicine

## 2023-11-16 ENCOUNTER — Other Ambulatory Visit: Payer: Self-pay

## 2023-11-16 ENCOUNTER — Encounter: Payer: Self-pay | Admitting: Emergency Medicine

## 2023-11-16 DIAGNOSIS — H6501 Acute serous otitis media, right ear: Secondary | ICD-10-CM | POA: Diagnosis not present

## 2023-11-16 LAB — POC COVID19/FLU A&B COMBO
Covid Antigen, POC: NEGATIVE
Influenza A Antigen, POC: NEGATIVE
Influenza B Antigen, POC: NEGATIVE

## 2023-11-16 MED ORDER — LOPERAMIDE HCL 2 MG PO CAPS: 2.0000 mg | ORAL_CAPSULE | Freq: Four times a day (QID) | ORAL | 0 refills | Status: AC | PRN

## 2023-11-16 MED ORDER — CEFDINIR 300 MG PO CAPS: 300.0000 mg | ORAL_CAPSULE | Freq: Two times a day (BID) | ORAL | 0 refills | Status: AC

## 2023-11-16 MED ORDER — ONDANSETRON 4 MG PO TBDP: 4.0000 mg | ORAL_TABLET | Freq: Three times a day (TID) | ORAL | 0 refills | Status: AC | PRN

## 2023-11-16 NOTE — ED Provider Notes (Addendum)
 Sheila Banks    CSN: [id number] Arrival date & time: 11/16/23  1652      History   Chief Complaint Chief Complaint  Patient presents with   Emesis    HPI Sheila Banks is a 53 y.o. female.   Patient presents for evaluation of subjective fever, and nonproductive cough only at nighttime, intermittent headaches, left-sided ear pain, vomiting and diarrhea beginning yesterday evening.  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  Has not attempted treatment.  Difficulty tolerating food and liquids due to symptoms.  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      Home Medications    Prior to Admission medications   Medication Sig Start Date End Date Taking? Authorizing Provider  cefdinir (OMNICEF) 300 MG capsule Take 1 capsule (300 mg total) by mouth 2 (two) times daily for 10 days. 11/16/23 11/26/23 Yes Shariya Gaster R, NP  hydrochlorothiazide (HYDRODIURIL) 25 MG tablet Take 25 mg by mouth daily.   Yes [provider]  loperamide (IMODIUM) 2 MG capsule Take 1 capsule (2 mg total) by mouth 4 (four) times daily as needed for diarrhea or loose stools. 11/16/23  Yes Andrya Roppolo R, NP  ondansetron (ZOFRAN-ODT) 4 MG disintegrating tablet Take 1 tablet (4 mg total) by mouth every 8 (eight) hours as needed. 11/16/23  Yes Lus Kriegel, Elita Boone, NP  acetaminophen (TYLENOL) 500 MG tablet Take 1,000 mg by mouth every 6 (six) hours as needed. Patient not taking: Reported on 11/22/2021    [provider]  citalopram (CELEXA) 20 MG tablet Take 20 mg by  mouth daily. Patient not taking: Reported on 11/22/2021    [provider]  famotidine (PEPCID) 20 MG tablet Take 20 mg by mouth 2 (two) times daily. Patient not taking: Reported on 11/22/2021    [provider]  fluticasone (FLONASE) 50 MCG/ACT nasal spray Place 1 spray into both nostrils daily. 09/27/21   [provider]  hydrOXYzine (ATARAX/VISTARIL) 25 MG tablet Take 25 mg by mouth every 6 (six) hours as needed for itching. Patient not taking: Reported on 11/22/2021    [provider]  lisinopril (ZESTRIL) 10 MG tablet Take by mouth.    [provider]  medroxyPROGESTERone (DEPO-PROVERA) 150 MG/ML injection Inject 150 mg into the muscle every 3 (three) months.    [provider]  montelukast (SINGULAIR) 10 MG tablet Take 1 tablet (10 mg total) by mouth at bedtime. Patient not taking: Reported on 11/22/2021 07/23/18   Domenick Gong, MD  predniSONE (DELTASONE) 20 MG tablet 3 Tabs PO Days 1-3, then 2 tabs PO Days 4-6, then 1 tab PO Day 7-9, then Half Tab PO Day 10-12 Patient not taking: Reported on 11/22/2021 07/23/18   Domenick Gong, MD  promethazine (PHENERGAN) 25 MG tablet Take 25 mg by mouth 2 (two) times daily as needed. 09/16/21   [provider]  VENTOLIN HFA 108 (90 Base) MCG/ACT inhaler 1-2 PUFFS AS NEEDED EVERY 4-6 HOURS AS NEEDED COUGH/WHEEZE INHALATION 90 DAYS Patient not taking: Reported  on 11/22/2021 04/24/18   [provider]    Family History Family History  Problem Relation Age of Onset   Hypertension Mother    Stroke Father     Social History Social History   Tobacco Use   Smoking status: Never   Smokeless tobacco: Never  Vaping Use   Vaping status: Never Used  Substance Use Topics   Alcohol use: No   Drug use: No     Allergies   Penicillins and Meloxicam   Review of Systems Review of Systems   Physical Exam Triage Vital Signs ED Triage Vitals  Encounter Vitals Group     BP  11/16/23 1747 131/80     Systolic BP Percentile --      Diastolic BP Percentile --      Pulse Rate 11/16/23 1747 (!) 113     Resp 11/16/23 1747 20     Temp 11/16/23 1747 100 F (37.8 C)     Temp Source 11/16/23 1747 Oral     SpO2 11/16/23 1747 94 %     Weight --      Height --      Head Circumference --      Peak Flow --      Pain Score 11/16/23 1748 8     Pain Loc --      Pain Education --      Exclude from Growth Chart --    No data found.  Updated Vital Signs BP 131/80 (BP Location: Left Arm)   Pulse (!) 113   Temp 100 F (37.8 C) (Oral)   Resp 20   SpO2 94%   Visual Acuity Right Eye Distance:   Left Eye Distance:   Bilateral Distance:    Right Eye Near:   Left Eye Near:    Bilateral Near:     Physical Exam Constitutional:      Appearance: Normal appearance.  HENT:     Head: Normocephalic.     Right Ear: Hearing, ear canal and external ear normal. Tympanic membrane is erythematous.     Left Ear: Hearing, tympanic membrane, ear canal and external ear normal.     Nose: Congestion present.     Mouth/Throat:     Pharynx: No oropharyngeal exudate or posterior oropharyngeal erythema.  Eyes:     Extraocular Movements: Extraocular movements intact.  Cardiovascular:     Rate and Rhythm: Normal rate and regular rhythm.     Pulses: Normal pulses.     Heart sounds: Normal heart sounds.  Pulmonary:     Effort: Pulmonary effort is normal.     Breath sounds: Normal breath sounds.  Abdominal:     General: Abdomen is flat. Bowel sounds are increased.     Palpations: Abdomen is soft.     Tenderness: There is generalized abdominal tenderness.  Musculoskeletal:     Cervical back: Normal range of motion and neck supple.  Skin:    General: Skin is warm and dry.  Neurological:     Mental Status: She is alert and oriented to person, place, and time. Mental status is at baseline.      UC Treatments / Results  Labs (all labs ordered are listed, but only abnormal  results are displayed) Labs Reviewed - No data to display  EKG   Radiology No results found.  Procedures Procedures (including critical care time)  Medications Ordered in UC Medications - No data to display  Initial Impression / Assessment and Plan / UC  Course  I have reviewed the triage vital signs and the nursing notes.  Pertinent labs & imaging results that were available during my care of the patient were reviewed by me and considered in my medical decision making (see chart for details).  Non Recurrent acute serous otitis media of right ear  Vitals are stable, low-grade fever of 100 with associated tachycardia, while ill-appearing patient in no signs of distress nor toxic appearing, erythema noted to the right tympanic membranes and generalized abdominal pain with increased bowel sounds noted on exam, etiology most likely viral with secondary ear infection, discussed this with patient, prescribed cefdinir, Zofran and Imodium, advised increase fluid intake until able to tolerate foods at baseline, advised against ear cleaning, may use over-the-counter analgesics and warm compresses to the external ear for comfort, may follow-up if symptoms persist worsen or recur  Final Clinical Impressions(s) / UC Diagnoses   Final diagnoses:  Non-recurrent acute serous otitis media of right ear     Discharge Instructions      Today you are being treated for an infection of the eardrum  Take cefdinir twice daily for 10 days, you should begin to see improvement after 48 hours of medication use and then it should progressively get better  Zofran every 8 hours as needed to help calm nausea and vomiting, placed under tongue and allow to dissolve, increase your fluid intake and to you are able to tolerate food as normal  You may use Imodium every 6 hours as needed for diarrhea  You may use Tylenol or ibuprofen for management of discomfort  May hold warm compresses to the ear for additional  comfort  Please not attempted any ear cleaning or object or fluid placement into the ear canal to prevent further irritation    For cough: honey 1/2 to 1 teaspoon (you can dilute the honey in water or another fluid).  You can also use guaifenesin and dextromethorphan for cough. You can use a humidifier for chest congestion and cough.  If you don't have a humidifier, you can sit in the bathroom with the hot shower running.      For sore throat: try warm salt water gargles, cepacol lozenges, throat spray, warm tea or water with lemon/honey, popsicles or ice, or OTC cold relief medicine for throat discomfort.   For congestion: take a daily anti-histamine like Zyrtec, Claritin, and a oral decongestant, such as pseudoephedrine.  You can also use Flonase 1-2 sprays in each nostril daily.   It is important to stay hydrated: drink plenty of fluids (water, gatorade/powerade/pedialyte, juices, or teas) to keep your throat moisturized and help further relieve irritation/discomfort.    ED Prescriptions     Medication Sig Dispense Auth. Provider   cefdinir (OMNICEF) 300 MG capsule Take 1 capsule (300 mg total) by mouth 2 (two) times daily for 10 days. 20 capsule Amere Bricco R, NP   ondansetron (ZOFRAN-ODT) 4 MG disintegrating tablet Take 1 tablet (4 mg total) by mouth every 8 (eight) hours as needed. 20 tablet Mairen Wallenstein, Hansel Starling R, NP   loperamide (IMODIUM) 2 MG capsule Take 1 capsule (2 mg total) by mouth 4 (four) times daily as needed for diarrhea or loose stools. 12 capsule Valinda Hoar, NP      PDMP not reviewed this encounter.   Valinda Hoar, NP 11/16/23 1814    Valinda Hoar, NP 11/16/23 [phone]

## 2023-11-16 NOTE — ED Triage Notes (Signed)
 Patient presents to [facility] for evaluation of emesis, diarrhea, left ear pain, headache and back pain since yesterday.

## 2023-11-16 NOTE — Discharge Instructions (Signed)
 Today you are being treated for an infection of the eardrum  Take cefdinir twice daily for 10 days, you should begin to see improvement after 48 hours of medication use and then it should progressively get better  Zofran every 8 hours as needed to help calm nausea and vomiting, placed under tongue and allow to dissolve, increase your fluid intake and to you are able to tolerate food as normal  You may use Imodium every 6 hours as needed for diarrhea  You may use Tylenol or ibuprofen for management of discomfort  May hold warm compresses to the ear for additional comfort  Please not attempted any ear cleaning or object or fluid placement into the ear canal to prevent further irritation    For cough: honey 1/2 to 1 teaspoon (you can dilute the honey in water or another fluid).  You can also use guaifenesin and dextromethorphan for cough. You can use a humidifier for chest congestion and cough.  If you don't have a humidifier, you can sit in the bathroom with the hot shower running.      For sore throat: try warm salt water gargles, cepacol lozenges, throat spray, warm tea or water with lemon/honey, popsicles or ice, or OTC cold relief medicine for throat discomfort.   For congestion: take a daily anti-histamine like Zyrtec, Claritin, and a oral decongestant, such as pseudoephedrine.  You can also use Flonase 1-2 sprays in each nostril daily.   It is important to stay hydrated: drink plenty of fluids (water, gatorade/powerade/pedialyte, juices, or teas) to keep your throat moisturized and help further relieve irritation/discomfort.
